# Patient Record
Sex: Female | Born: 1995 | Race: White | Hispanic: No | Marital: Single | Smoking: Current every day smoker
Health system: Southern US, Community
[De-identification: ages and names within clinical notes are randomized; demographics above are authoritative.]

---

## 2021-11-28 ENCOUNTER — Emergency Department (HOSPITAL_COMMUNITY): Payer: Self-pay

## 2021-11-28 ENCOUNTER — Emergency Department (HOSPITAL_COMMUNITY): Payer: Self-pay | Admitting: Anesthesiology

## 2021-11-28 ENCOUNTER — Encounter (HOSPITAL_COMMUNITY): Payer: Self-pay

## 2021-11-28 ENCOUNTER — Encounter (HOSPITAL_COMMUNITY)
Admission: EM | Discharge status: Against Medical Advice | Payer: Self-pay | Source: Home / Self Care | Attending: Neurological Surgery

## 2021-11-28 ENCOUNTER — Inpatient Hospital Stay (HOSPITAL_COMMUNITY)
Admission: EM | Admit: 2021-11-28 | Discharge: 2021-12-07 | DRG: 853 | Discharge status: Against Medical Advice | Payer: Self-pay | Attending: Neurological Surgery | Admitting: Neurological Surgery

## 2021-11-28 ENCOUNTER — Other Ambulatory Visit: Payer: Self-pay

## 2021-11-28 DIAGNOSIS — F1110 Opioid abuse, uncomplicated: Secondary | ICD-10-CM | POA: Diagnosis present

## 2021-11-28 DIAGNOSIS — G061 Intraspinal abscess and granuloma: Secondary | ICD-10-CM

## 2021-11-28 DIAGNOSIS — B9561 Methicillin susceptible Staphylococcus aureus infection as the cause of diseases classified elsewhere: Secondary | ICD-10-CM

## 2021-11-28 DIAGNOSIS — R29898 Other symptoms and signs involving the musculoskeletal system: Secondary | ICD-10-CM

## 2021-11-28 DIAGNOSIS — K5900 Constipation, unspecified: Secondary | ICD-10-CM | POA: Diagnosis not present

## 2021-11-28 DIAGNOSIS — F1990 Other psychoactive substance use, unspecified, uncomplicated: Secondary | ICD-10-CM

## 2021-11-28 DIAGNOSIS — G9529 Other cord compression: Secondary | ICD-10-CM | POA: Diagnosis present

## 2021-11-28 DIAGNOSIS — R7881 Bacteremia: Secondary | ICD-10-CM

## 2021-11-28 DIAGNOSIS — B182 Chronic viral hepatitis C: Secondary | ICD-10-CM | POA: Diagnosis present

## 2021-11-28 DIAGNOSIS — Z5329 Procedure and treatment not carried out because of patient's decision for other reasons: Secondary | ICD-10-CM | POA: Diagnosis present

## 2021-11-28 DIAGNOSIS — A4101 Sepsis due to Methicillin susceptible Staphylococcus aureus: Principal | ICD-10-CM | POA: Diagnosis present

## 2021-11-28 DIAGNOSIS — G062 Extradural and subdural abscess, unspecified: Secondary | ICD-10-CM | POA: Diagnosis present

## 2021-11-28 DIAGNOSIS — G8220 Paraplegia, unspecified: Secondary | ICD-10-CM | POA: Diagnosis present

## 2021-11-28 DIAGNOSIS — Z6831 Body mass index (BMI) 31.0-31.9, adult: Secondary | ICD-10-CM

## 2021-11-28 DIAGNOSIS — E669 Obesity, unspecified: Secondary | ICD-10-CM | POA: Diagnosis present

## 2021-11-28 DIAGNOSIS — G839 Paralytic syndrome, unspecified: Secondary | ICD-10-CM

## 2021-11-28 DIAGNOSIS — R202 Paresthesia of skin: Principal | ICD-10-CM

## 2021-11-28 DIAGNOSIS — F17210 Nicotine dependence, cigarettes, uncomplicated: Secondary | ICD-10-CM | POA: Diagnosis present

## 2021-11-28 HISTORY — PX: THORACIC LAMINECTOMY FOR EPIDURAL ABSCESS: SHX6115

## 2021-11-28 LAB — SEDIMENTATION RATE: Sed Rate: 82 mm/hr — ABNORMAL HIGH (ref 0–22)

## 2021-11-28 LAB — CBC WITH DIFFERENTIAL/PLATELET
Abs Immature Granulocytes: 0.41 10*3/uL — ABNORMAL HIGH (ref 0.00–0.07)
Basophils Absolute: 0.1 10*3/uL (ref 0.0–0.1)
Basophils Relative: 0 %
Eosinophils Absolute: 0.1 10*3/uL (ref 0.0–0.5)
Eosinophils Relative: 0 %
HCT: 35.5 % — ABNORMAL LOW (ref 36.0–46.0)
Hemoglobin: 12 g/dL (ref 12.0–15.0)
Immature Granulocytes: 2 %
Lymphocytes Relative: 11 %
Lymphs Abs: 2.6 10*3/uL (ref 0.7–4.0)
MCH: 29.2 pg (ref 26.0–34.0)
MCHC: 33.8 g/dL (ref 30.0–36.0)
MCV: 86.4 fL (ref 80.0–100.0)
Monocytes Absolute: 1.5 10*3/uL — ABNORMAL HIGH (ref 0.1–1.0)
Monocytes Relative: 6 %
Neutro Abs: 20 10*3/uL — ABNORMAL HIGH (ref 1.7–7.7)
Neutrophils Relative %: 81 %
Platelets: 450 10*3/uL — ABNORMAL HIGH (ref 150–400)
RBC: 4.11 MIL/uL (ref 3.87–5.11)
RDW: 13.1 % (ref 11.5–15.5)
WBC: 24.6 10*3/uL — ABNORMAL HIGH (ref 4.0–10.5)
nRBC: 0 % (ref 0.0–0.2)

## 2021-11-28 LAB — COMPREHENSIVE METABOLIC PANEL
ALT: 33 U/L (ref 0–44)
AST: 29 U/L (ref 15–41)
Albumin: 2.5 g/dL — ABNORMAL LOW (ref 3.5–5.0)
Alkaline Phosphatase: 102 U/L (ref 38–126)
Anion gap: 9 (ref 5–15)
BUN: 7 mg/dL (ref 6–20)
CO2: 25 mmol/L (ref 22–32)
Calcium: 8.8 mg/dL — ABNORMAL LOW (ref 8.9–10.3)
Chloride: 96 mmol/L — ABNORMAL LOW (ref 98–111)
Creatinine, Ser: 0.51 mg/dL (ref 0.44–1.00)
GFR, Estimated: >60 mL/min (ref >60)
Glucose, Bld: 104 mg/dL — ABNORMAL HIGH (ref 70–99)
Potassium: 4.7 mmol/L (ref 3.5–5.1)
Sodium: 130 mmol/L — ABNORMAL LOW (ref 135–145)
Total Bilirubin: 0.5 mg/dL (ref 0.3–1.2)
Total Protein: 7.6 g/dL (ref 6.5–8.1)

## 2021-11-28 LAB — URINALYSIS, ROUTINE W REFLEX MICROSCOPIC
Bilirubin Urine: NEGATIVE
Glucose, UA: NEGATIVE mg/dL
Ketones, ur: NEGATIVE mg/dL
Nitrite: POSITIVE — AB
Protein, ur: NEGATIVE mg/dL
Specific Gravity, Urine: 1.013 (ref 1.005–1.030)
WBC, UA: >50 WBC/hpf — ABNORMAL HIGH (ref 0–5)
pH: 7 (ref 5.0–8.0)

## 2021-11-28 LAB — HIV ANTIBODY (ROUTINE TESTING W REFLEX): HIV Screen 4th Generation wRfx: NONREACTIVE

## 2021-11-28 LAB — ACETAMINOPHEN LEVEL: Acetaminophen (Tylenol), Serum: <10 ug/mL — ABNORMAL LOW (ref 10–30)

## 2021-11-28 LAB — PROTIME-INR
INR: 1.2 (ref 0.8–1.2)
Prothrombin Time: 14.9 seconds (ref 11.4–15.2)

## 2021-11-28 LAB — RAPID URINE DRUG SCREEN, HOSP PERFORMED
Amphetamines: POSITIVE — AB
Barbiturates: NOT DETECTED
Benzodiazepines: NOT DETECTED
Cocaine: NOT DETECTED
Opiates: NOT DETECTED
Tetrahydrocannabinol: NOT DETECTED

## 2021-11-28 LAB — I-STAT BETA HCG BLOOD, ED (MC, WL, AP ONLY): I-stat hCG, quantitative: <5 m[IU]/mL (ref <5)

## 2021-11-28 LAB — C-REACTIVE PROTEIN: CRP: 19.3 mg/dL — ABNORMAL HIGH (ref <1.0)

## 2021-11-28 LAB — SURGICAL PCR SCREEN
MRSA, PCR: NEGATIVE
Staphylococcus aureus: POSITIVE — AB

## 2021-11-28 LAB — HEPATITIS C ANTIBODY: HCV Ab: REACTIVE — AB

## 2021-11-28 IMAGING — MR MR CERVICAL SPINE WO/W CM
5 of 8 series · 19 of 48 positions shown · IV contrast (7.5 GAD)
Comparison: None.

CLINICAL DATA: Bilateral lower extremity numbness beginning 2 days
ago with proximal progression to the T8 level. Bilateral leg
weakness.

EXAM:
MRI CERVICAL SPINE WITHOUT AND WITH CONTRAST
TECHNIQUE: Multiplanar and multiecho pulse sequences of the cervical spine, to
include the craniocervical junction and cervicothoracic junction,
were obtained without and with intravenous contrast.
CONTRAST:  7.5mL GADAVIST GADOBUTROL 1 MMOL/ML IV SOLN

[Series 15: T2 · sagittal · 3.0mm · 0.39mm/px · 3 of 17 slices shown (1 of 2)]
[im 1/17]
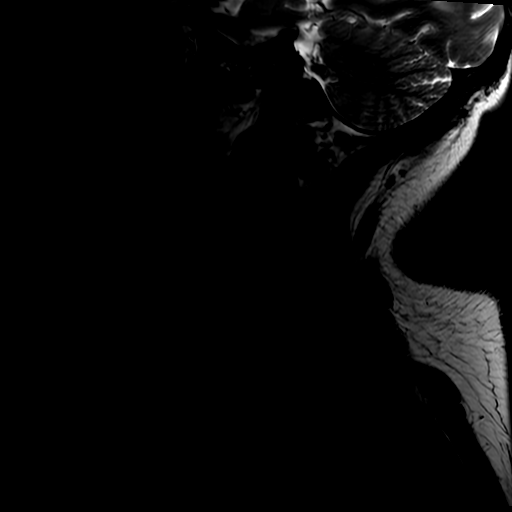
[im 9/17]
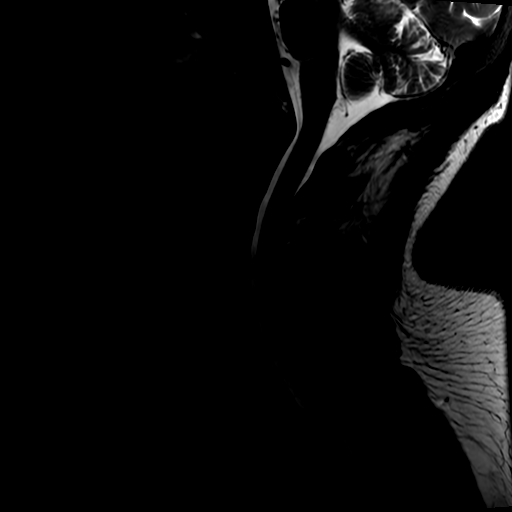
[im 17/17]
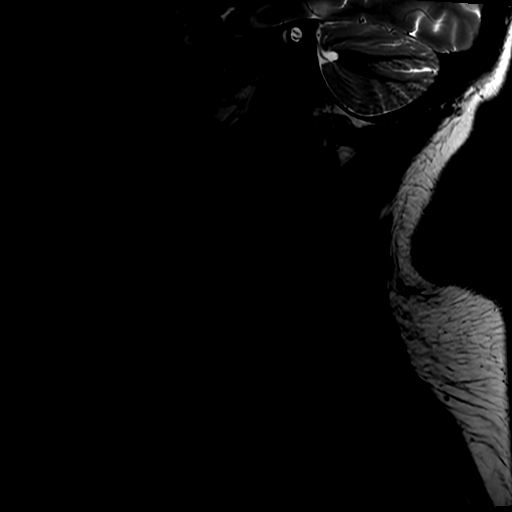

[Series 17: STIR · sagittal · 3.0mm · 0.39mm/px · 1 of 17 slices shown]
[im 1/17]
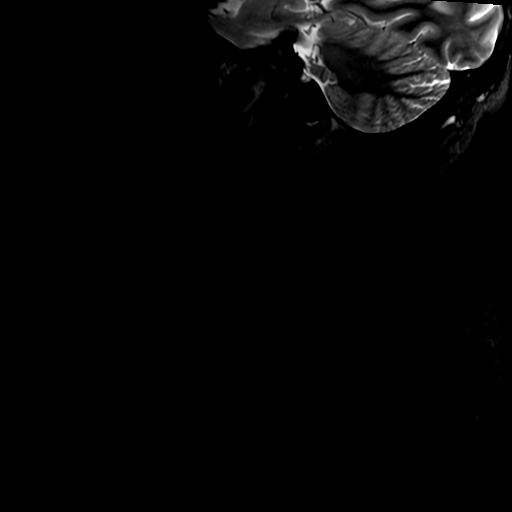

[Series 19: T2 · axial · 3.0mm · 0.35mm/px · z∈[-56,+60]mm · 6 of 33 slices shown (2 of 2)]
[im 1/33]
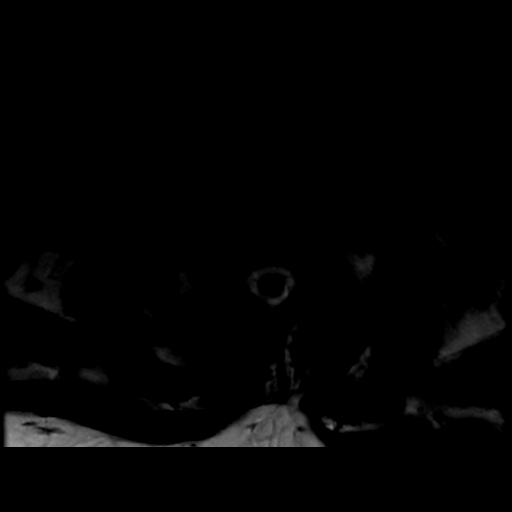
[im 7/33]
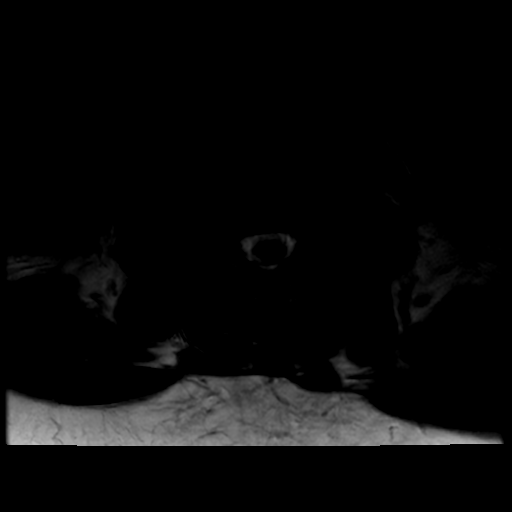
[im 13/33]
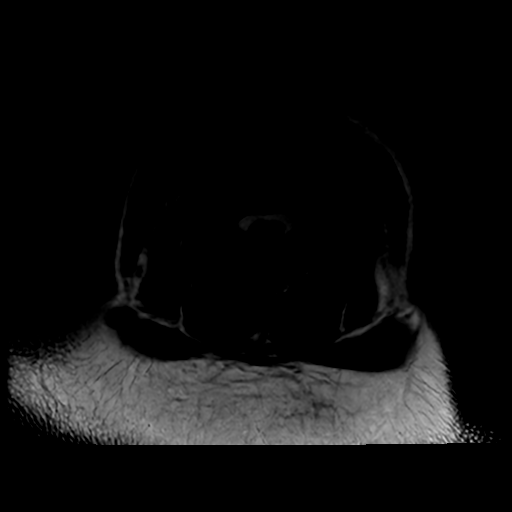
[im 20/33]
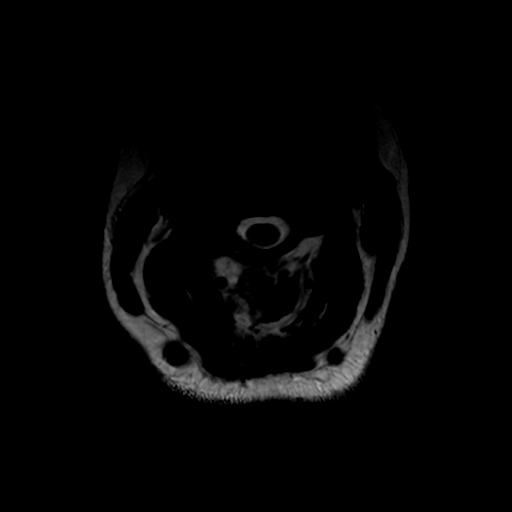
[im 26/33]
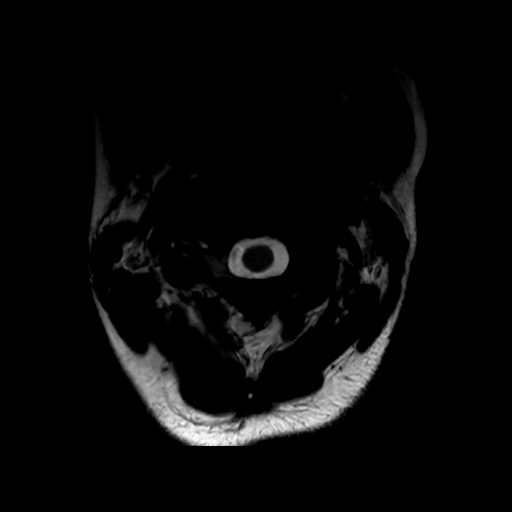
[im 33/33]
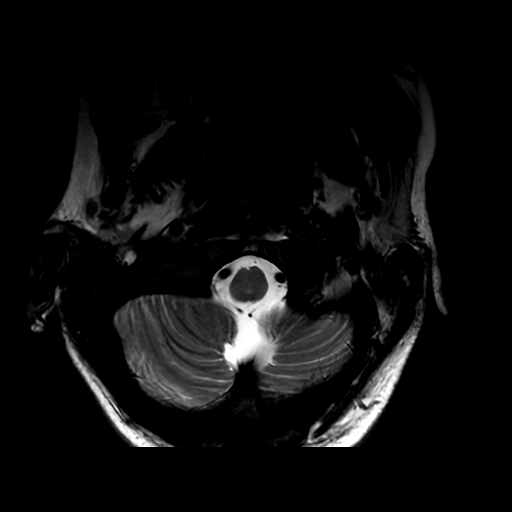

[Series 20: T1 · axial · non-contrast · 3.0mm · 0.35mm/px · z∈[-56,+60]mm · 6 of 34 slices shown]
[im 1/34]
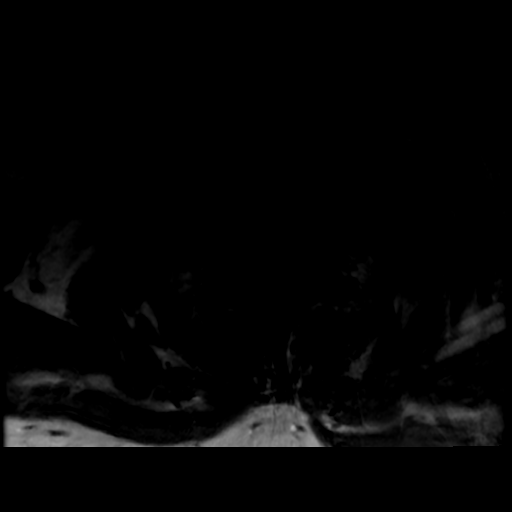
[im 7/34]
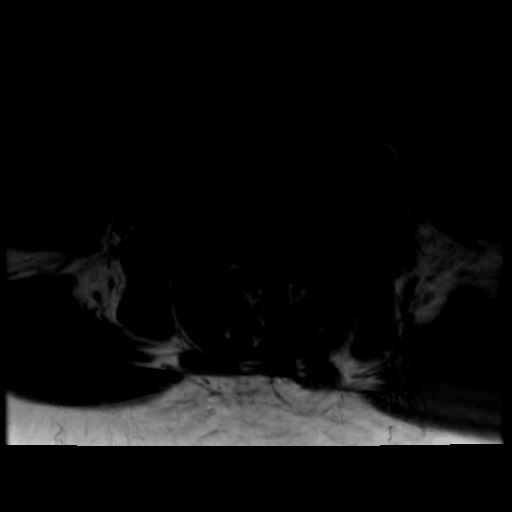
[im 14/34]
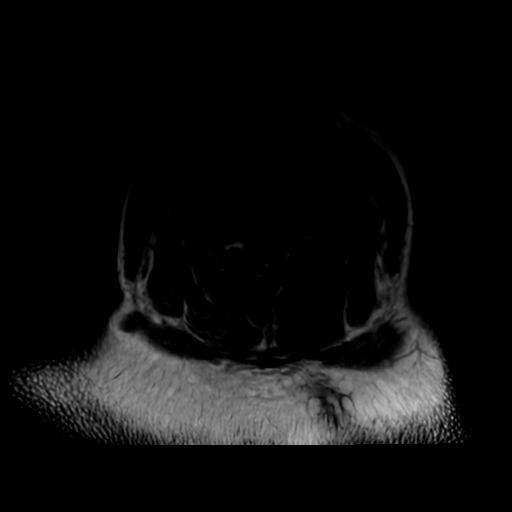
[im 20/34]
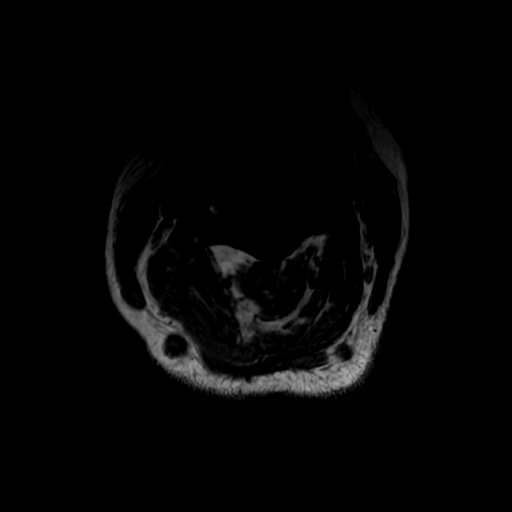
[im 27/34]
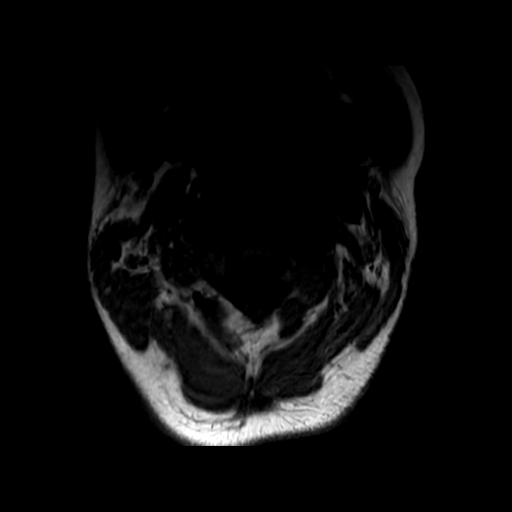
[im 34/34]
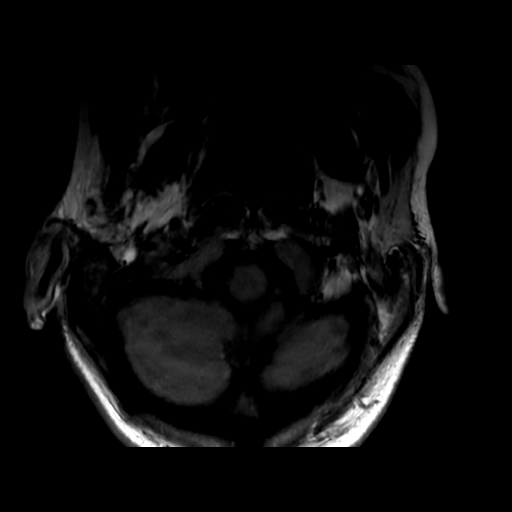

[Series 31: T1 fat-sat post-contrast · sagittal · 3.0mm · 0.39mm/px · 3 of 17 slices shown]
[im 1/17]
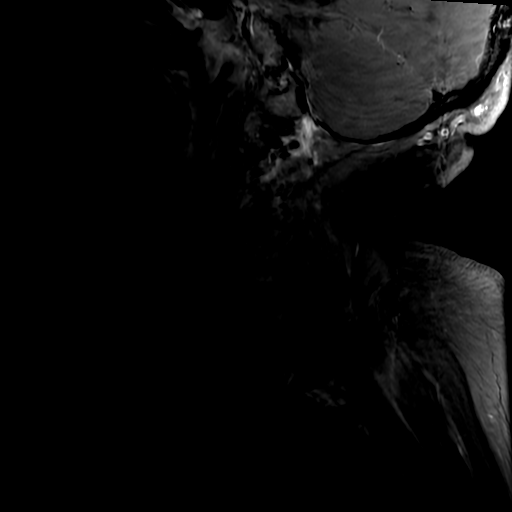
[im 9/17]
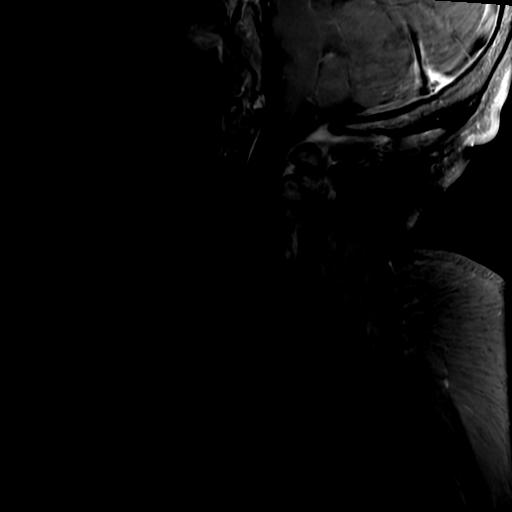
[im 17/17]
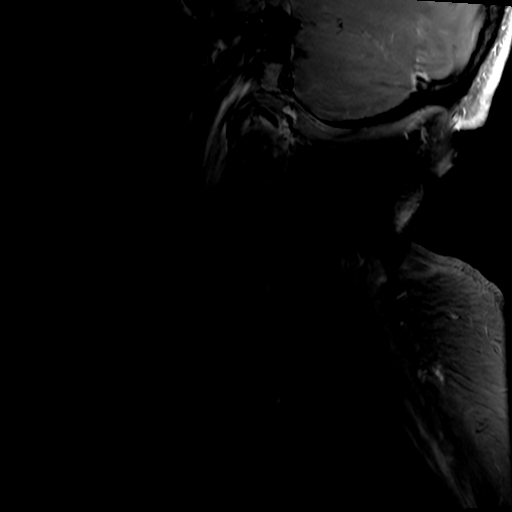

[19 of 48 positions shown; findings below may reference images not displayed]

FINDINGS: The study is motion degraded, including moderate to severe motion
artifact on multiple axial sequences.

Alignment: Normal.

Vertebrae: Diffusely diminished bone marrow T1 signal intensity,
nonspecific but can be seen with anemia, smoking, and obesity. No
suspicious focal marrow lesion, fracture, significant marrow edema,
or evidence of discitis.

Cord: Normal signal and morphology.

Posterior Fossa, vertebral arteries, paraspinal tissues:
Unremarkable.

Disc levels:

Preserved disc height and hydration throughout the cervical spine.
No sizable disc herniation or stenosis.
IMPRESSION: Motion degraded examination. No disc herniation or stenosis. Normal
appearance of the cervical spinal cord.

## 2021-11-28 IMAGING — MR MR THORACIC SPINE WO/W CM
5 of 9 series · 18 of 48 positions shown · IV contrast (gadavist)
Comparison: None.

CLINICAL DATA: Mid-back pain, neuro deficit. Bilateral lower
extremity numbness beginning 2 days ago with proximal progression to
the T8 level. Bilateral leg weakness.

EXAM:
MRI THORACIC WITHOUT AND WITH CONTRAST
TECHNIQUE: Multiplanar and multiecho pulse sequences of the thoracic spine were
obtained without and with intravenous contrast.
CONTRAST:  7.5mL GADAVIST GADOBUTROL 1 MMOL/ML IV SOLN

[Series 22: T1 · sagittal · 3.0mm · 0.90mm/px · 1 of 16 slices shown (1 of 3)]
[im 1/16]
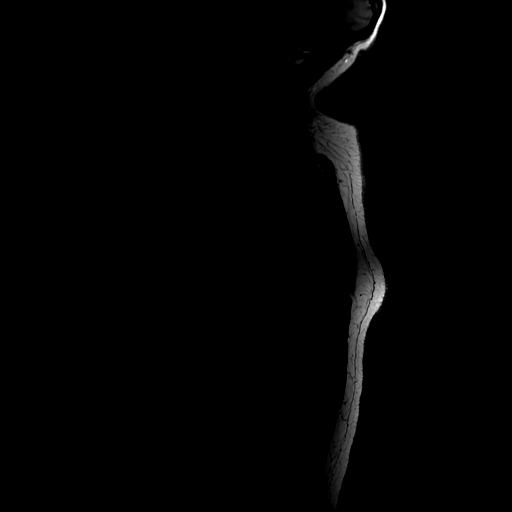

[Series 23: T2 · sagittal · 3.0mm · 0.66mm/px · 2 of 18 slices shown (1 of 2)]
[im 1/18]
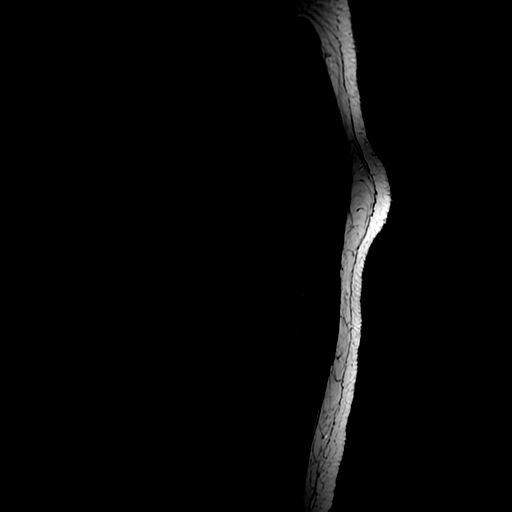
[im 18/18]
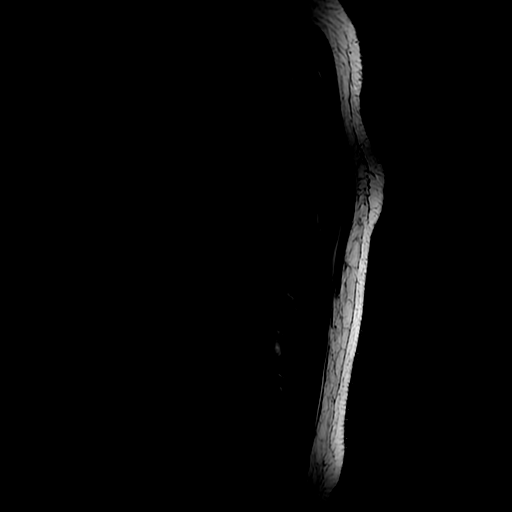

[Series 25: T1 · sagittal · 3.0mm · 0.66mm/px · 3 of 18 slices shown (2 of 3)]
[im 1/18]
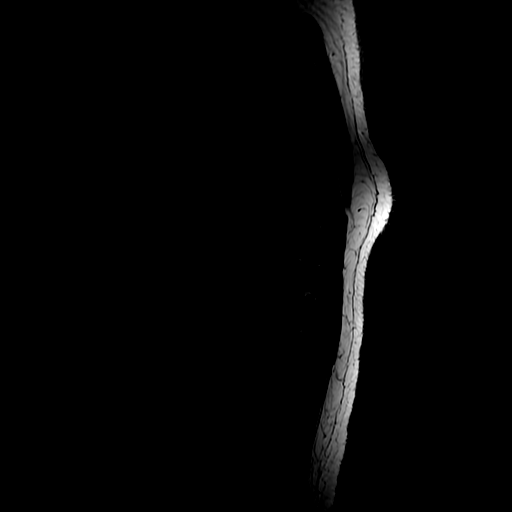
[im 9/18]
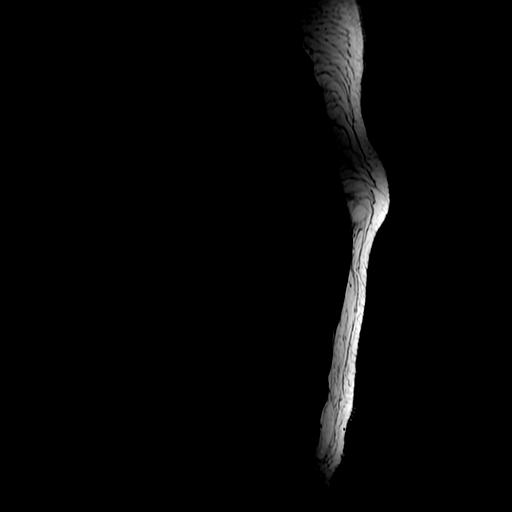
[im 18/18]
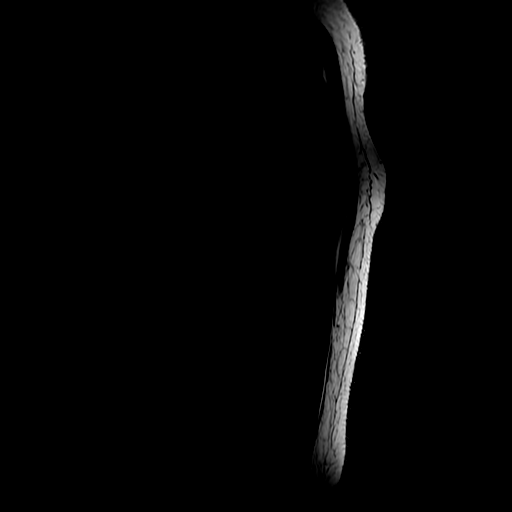

[Series 27: T2 · axial · 4.0mm · 0.39mm/px · z∈[-344,-52]mm · 9 of 55 slices shown (2 of 2)]
[im 1/55]
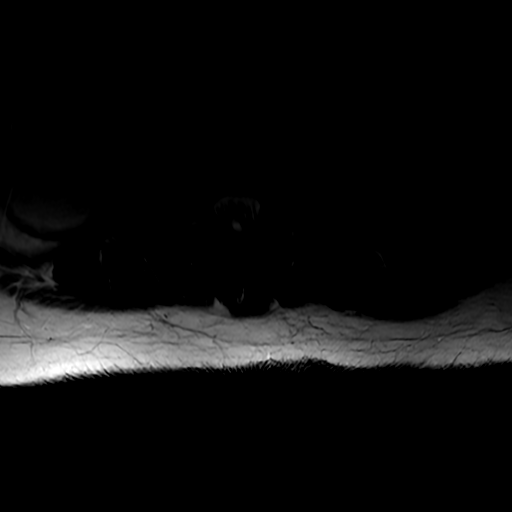
[im 7/55]
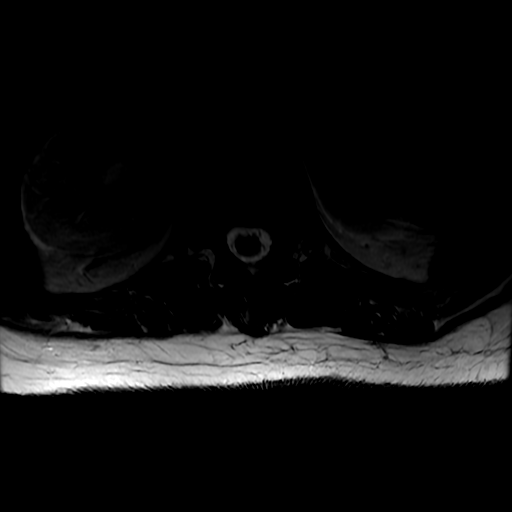
[im 14/55]
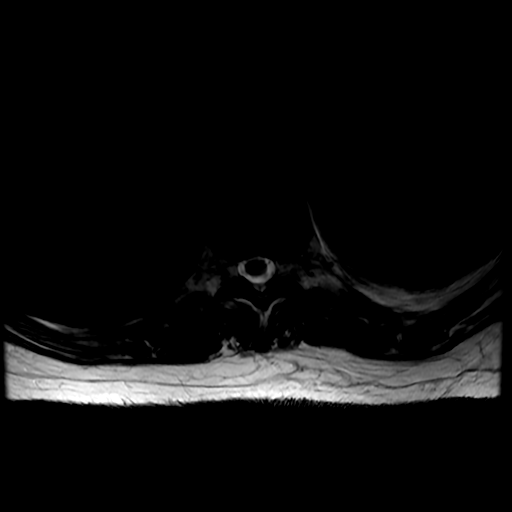
[im 21/55]
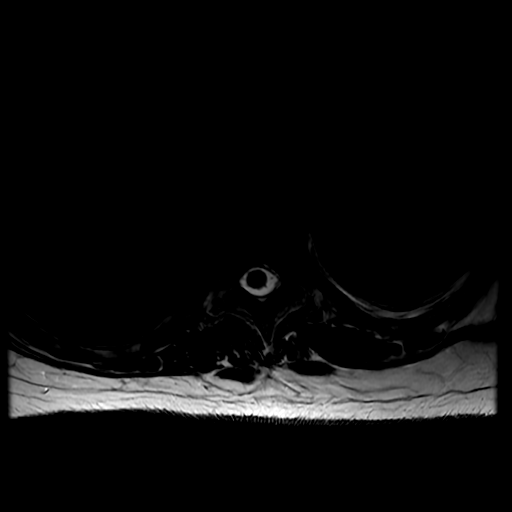
[im 28/55]
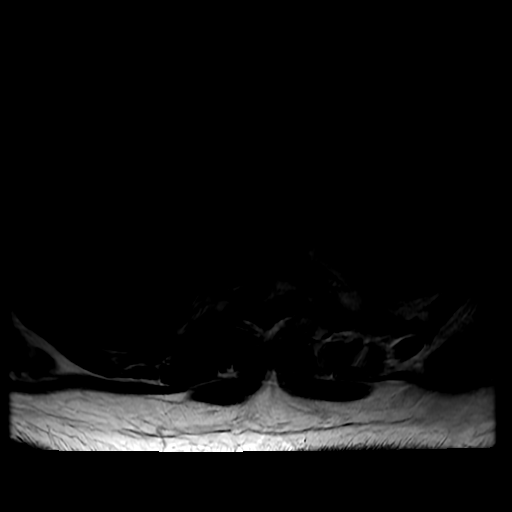
[im 34/55]
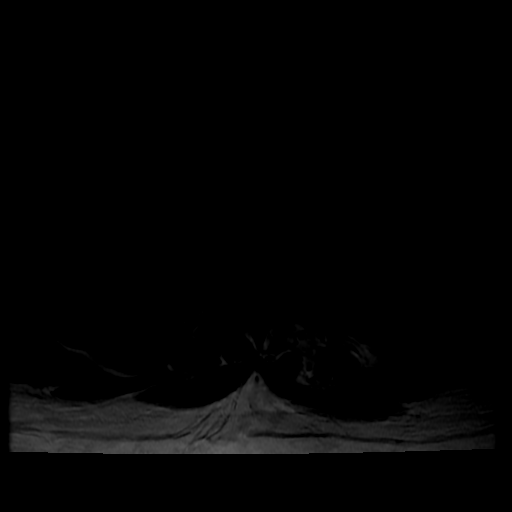
[im 41/55]
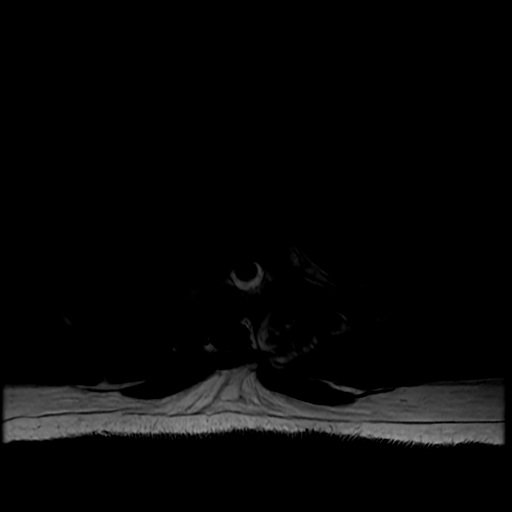
[im 48/55]
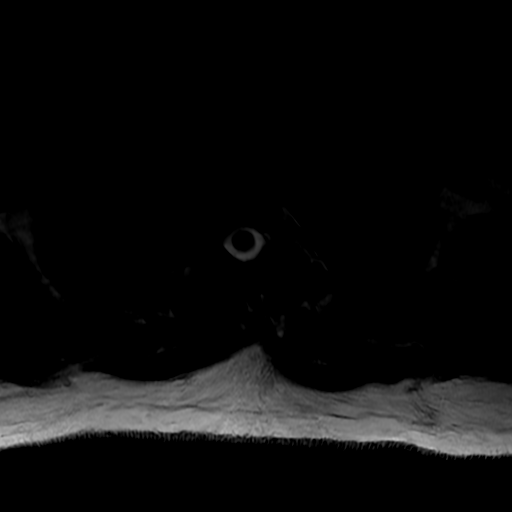
[im 55/55]
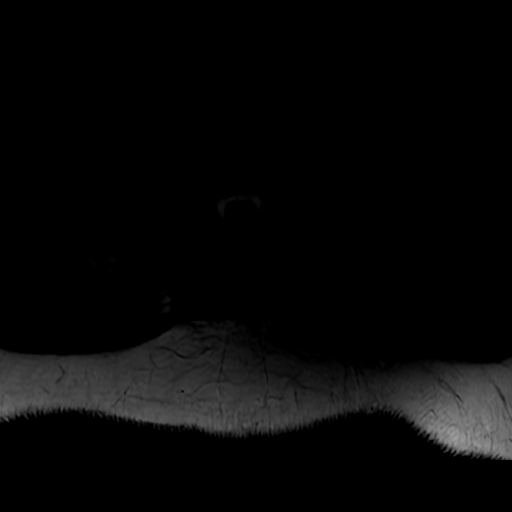

[Series 28: T1 · axial · non-contrast · 4.0mm · 0.39mm/px · z∈[-344,-274]mm · 3 of 55 slices shown (3 of 3)]
[im 1/55]
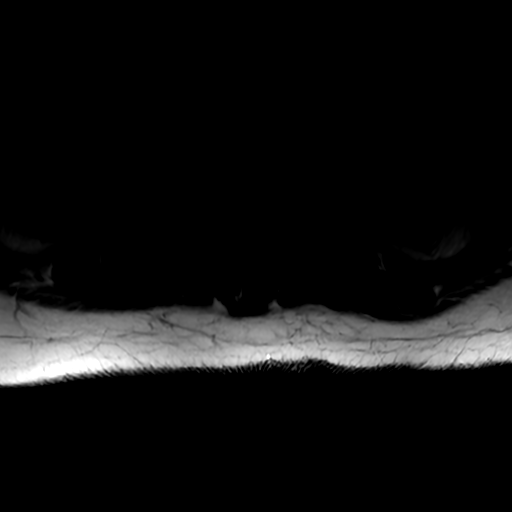
[im 7/55]
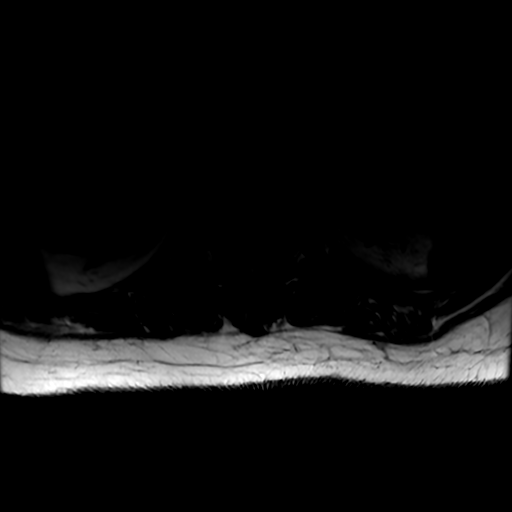
[im 14/55]
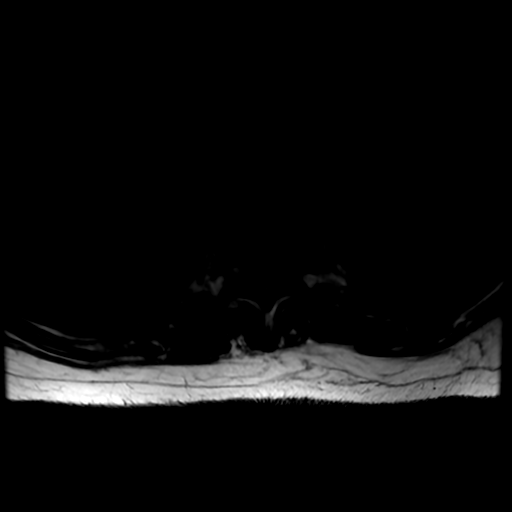

[18 of 48 positions shown; findings below may reference images not displayed]

FINDINGS: Alignment:  Mild lower thoracic levoscoliosis.  No listhesis.

Vertebrae: Diffusely diminished bone marrow T1 signal intensity,
nonspecific though can be seen with anemia, smoking, and obesity. No
suspicious focal marrow lesion, fracture, or evidence of discitis.
Multiple small Schmorl's nodes in the lower thoracic spine. Fluid in
the left T6-7 facet joint with associated mild marrow edema and
enhancement and more prominent edema and enhancement in the adjacent
paraspinal soft tissues. Abnormal T2 hyperintense dorsal epidural
material in the spinal canal extending from T3-T8, greatest from
T5-T7 where it measures up to 8 mm in AP thickness. This epidural
material largely enhances, however there are a few subcentimeter
nonenhancing foci at T6.

Cord: The above described epidural process results in severe cord
compression at T6, and cord edema is suspected at T6-7.

Paraspinal and other soft tissues: Left-sided paraspinal soft tissue
inflammation as noted above, extending superiorly greater than
inferiorly from the T6-7 facet joint. 1 cm nonenhancing fluid
collection posterosuperior to the left T6-7 facet joint. Partially
visualized mild bilateral hydronephrosis, right greater than left.
Trace left pleural effusion.

Disc levels:

Severe spinal stenosis from T5-6 to T6-7 by the epidural process.
IMPRESSION: 1. Dorsal epidural collection in the midthoracic spine most
consistent with infectious phlegmon/abscess. This results in severe
spinal stenosis with cord compression and likely cord edema.
2. Inflammation about the left T6-7 facet joint likely reflecting
septic facet arthritis with adjacent 1 cm abscess in the paraspinal
soft tissues.

Critical Value/emergent results were called by telephone at the time
of interpretation on [DATE] at [DATE] to Dr. CELVIN, who
verbally acknowledged these results.

## 2021-11-28 IMAGING — MR MR HEAD WO/W CM
7 of 13 series · 27 of 48 positions shown · IV contrast (gadavist)
Comparison: Head CT [DATE]

CLINICAL DATA: Motor neuron disease suspected. Bilateral lower
extremity numbness beginning 2 days ago with proximal progression to
the T8 level. Bilateral leg weakness.

EXAM:
MRI HEAD WITHOUT AND WITH CONTRAST
TECHNIQUE: Multiplanar, multiecho pulse sequences of the brain and surrounding
structures were obtained without and with intravenous contrast.
CONTRAST:  7.5mL GADAVIST GADOBUTROL 1 MMOL/ML IV SOLN

[Series 3: DWI · axial · 3.0mm · 0.94mm/px · z∈[-136,+9]mm · 7 of 100 slices shown (1 of 2)]
[im 1/100]
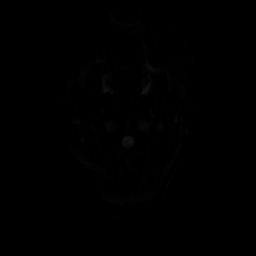
[im 17/100]
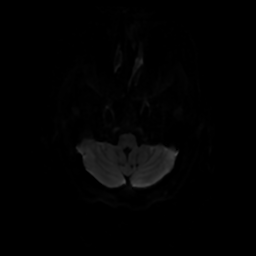
[im 34/100]
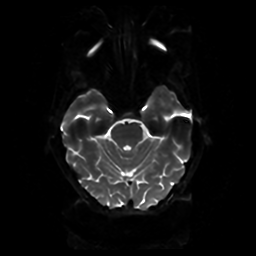
[im 50/100]
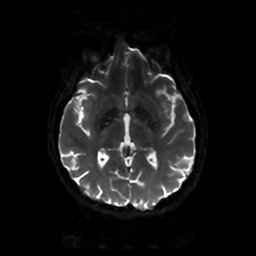
[im 67/100]
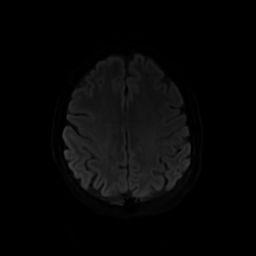
[im 83/100]
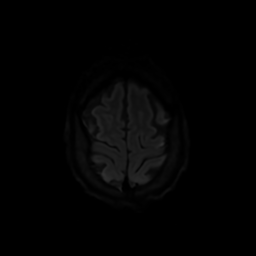
[im 100/100]
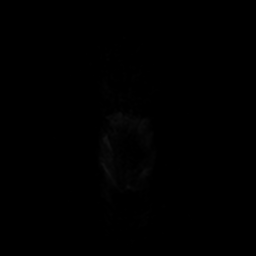

[Series 4: DWI · coronal · 4.0mm · 0.94mm/px · 5 of 74 slices shown (2 of 2)]
[im 1/74]
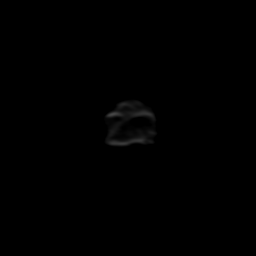
[im 19/74]
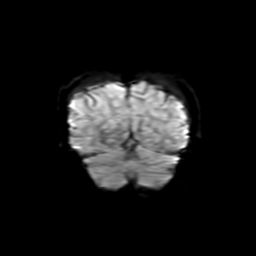
[im 37/74]
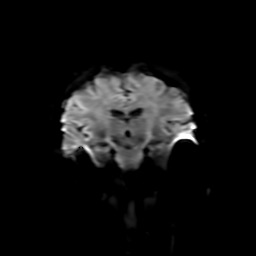
[im 55/74]
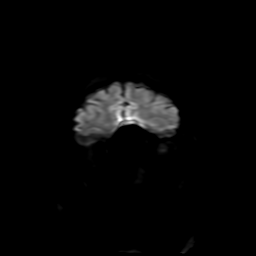
[im 74/74]
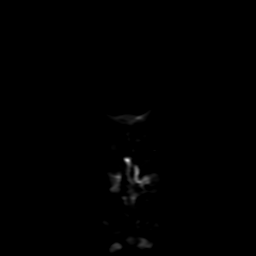

[Series 5: FLAIR · sagittal · 5.0mm · 0.23mm/px · 2 of 24 slices shown (1 of 3)]
[im 1/24]
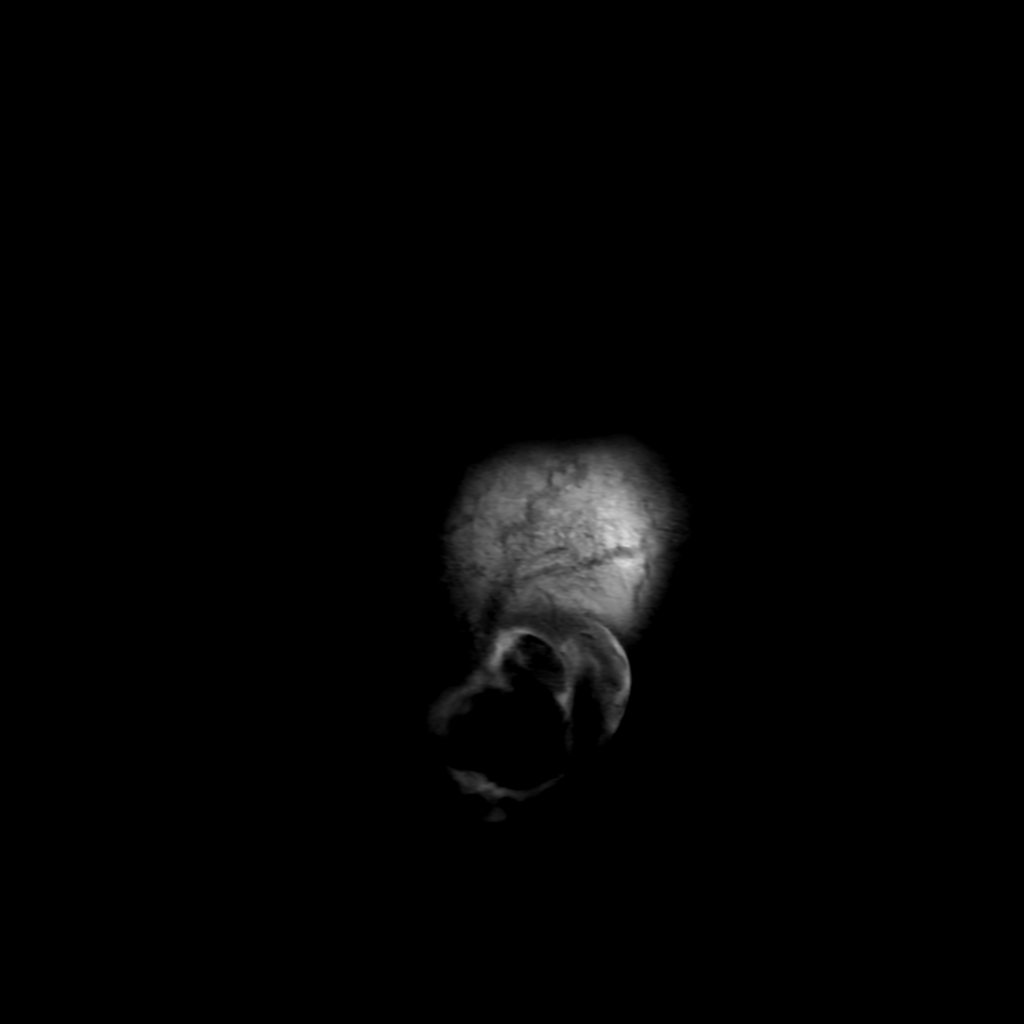
[im 24/24]
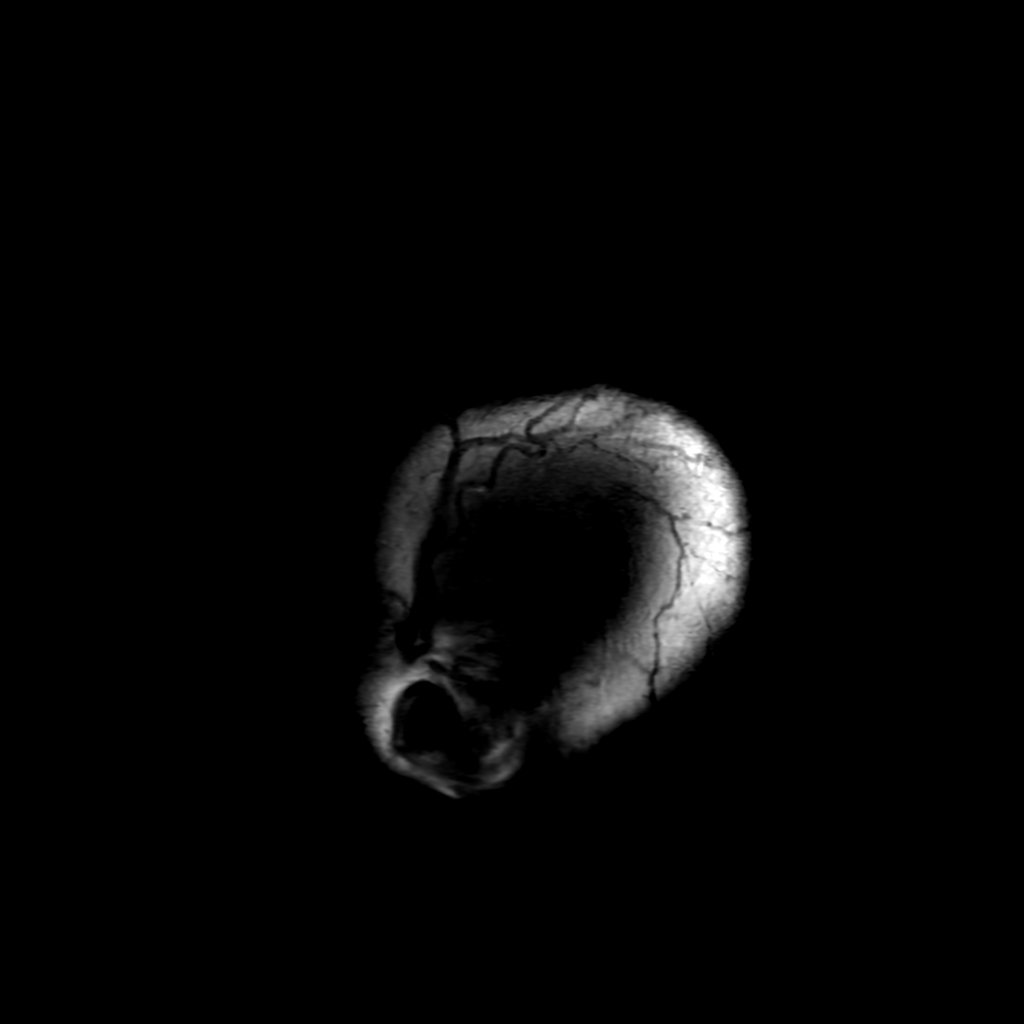

[Series 7: FLAIR · axial · 4.0mm · 0.47mm/px · z∈[-135,+8]mm · 3 of 34 slices shown (2 of 3)]
[im 1/34]
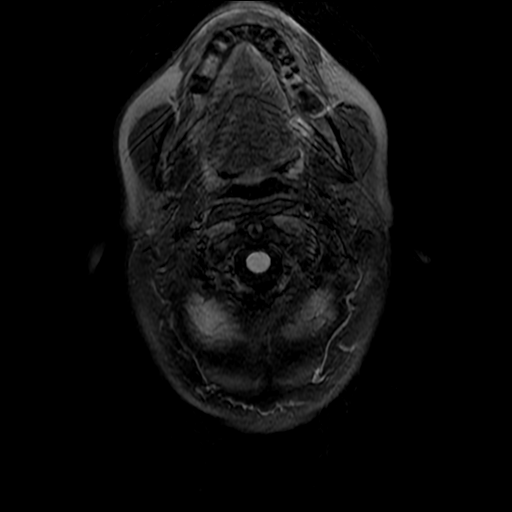
[im 17/34]
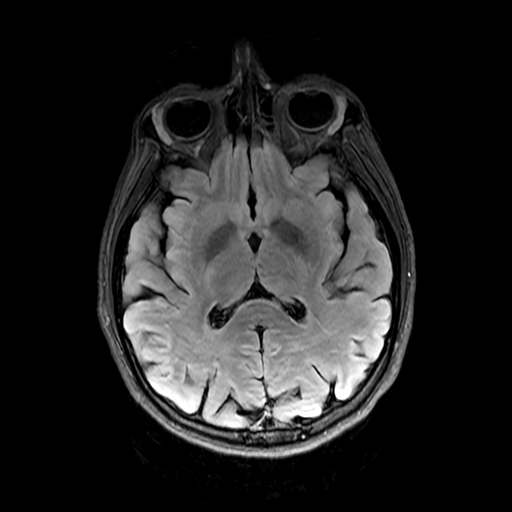
[im 34/34]
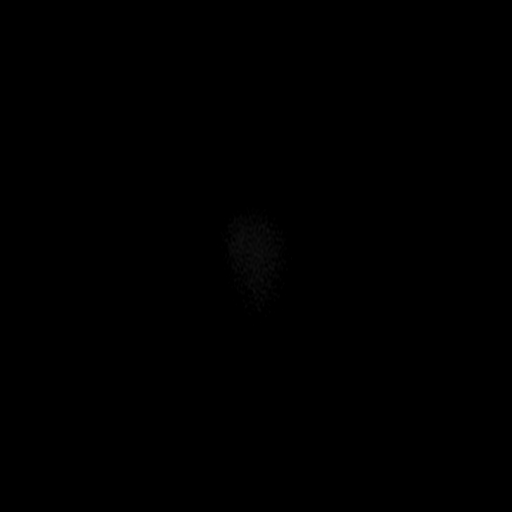

[Series 9: FLAIR · axial · 4.0mm · 0.47mm/px · z∈[-135,+8]mm · 3 of 34 slices shown (3 of 3)]
[im 1/34]
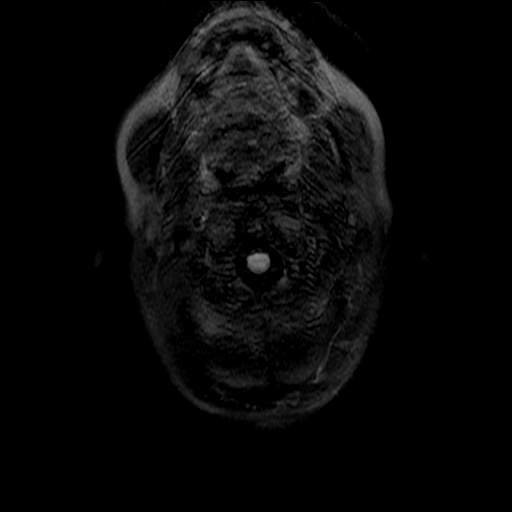
[im 17/34]
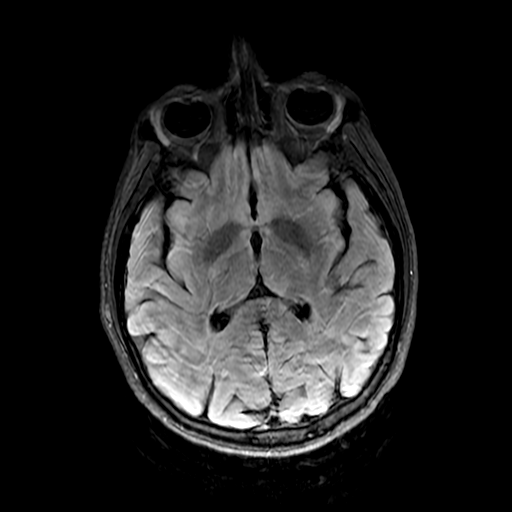
[im 34/34]
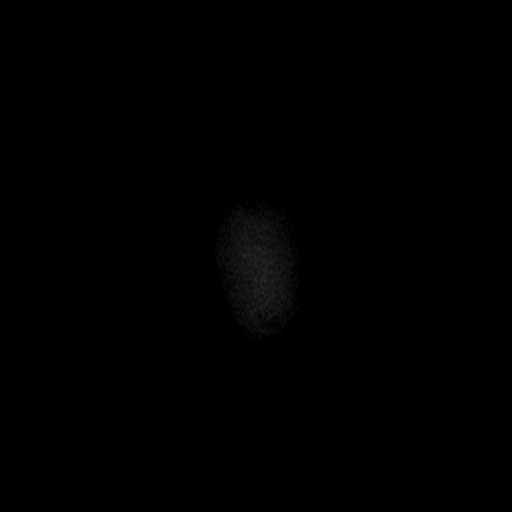

[Series 350: ADC · axial · 3.0mm · 0.94mm/px · z∈[-136,+9]mm · 4 of 50 slices shown (1 of 2)]
[im 1/50]
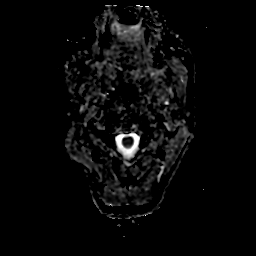
[im 17/50]
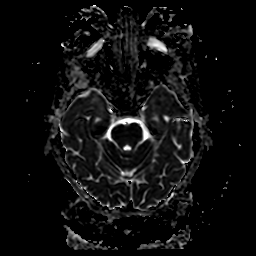
[im 33/50]
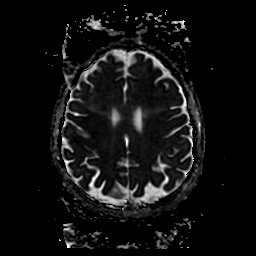
[im 50/50]
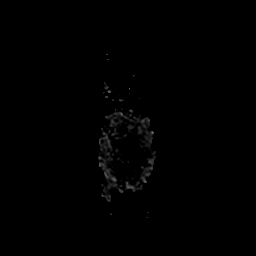

[Series 450: ADC · coronal · 4.0mm · 0.94mm/px · 3 of 37 slices shown (2 of 2)]
[im 1/37]
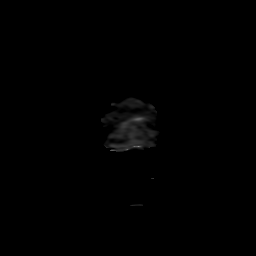
[im 19/37]
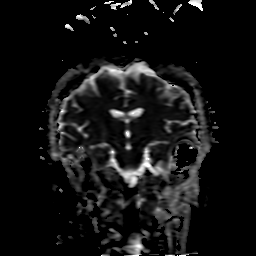
[im 37/37]
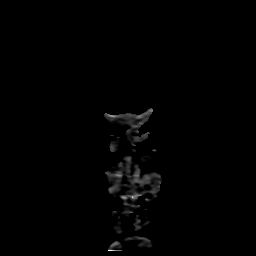

[27 of 48 positions shown; findings below may reference images not displayed]

FINDINGS: The study is moderately motion degraded.

Brain: There is no evidence of an acute infarct, intracranial
hemorrhage, mass, midline shift, or extra-axial fluid collection.
The ventricles and sulci are normal. No brain parenchymal signal
abnormality or abnormal enhancement is identified within limitations
of motion artifact.

Vascular: Major intracranial vascular flow voids are preserved.

Skull and upper cervical spine: No suspicious marrow lesion.

Sinuses/Orbits: Unremarkable orbits. Paranasal sinuses and mastoid
air cells are clear.

Other: None.
IMPRESSION: Negative brain MRI within limitations of motion artifact.

## 2021-11-28 IMAGING — RF DG THORACOLUMBAR SPINE 2V
1 series · 1 of 1 positions shown · non-contrast
Comparison: MR thoracic spine dated [DATE]

CLINICAL DATA: Localizing image 4 thoracic laminectomy for epidural
abscess.

EXAM:
THORACOLUMBAR SPINE 1V

[Series 1: run · 1 of 1 slices shown]
[im 1/1]
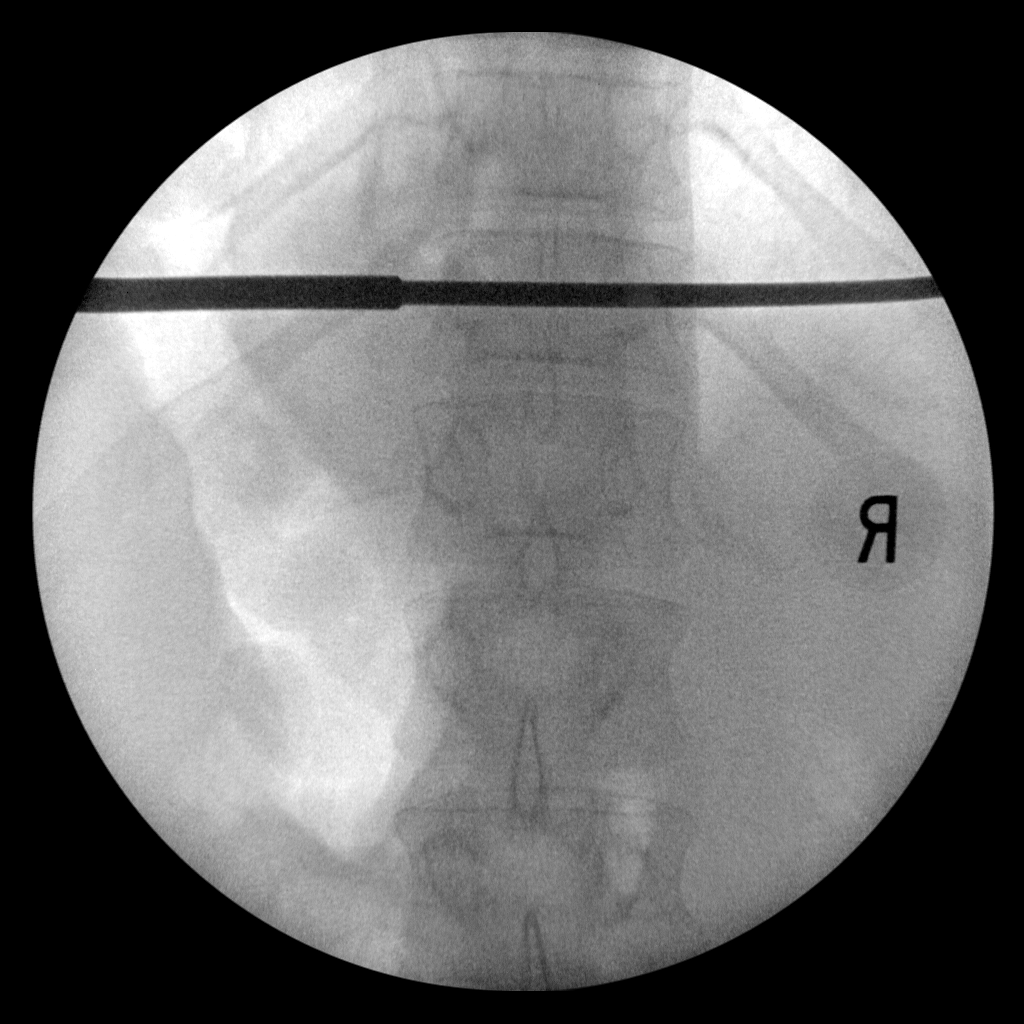

[1 of 1 positions shown; findings below may reference images not displayed]

FINDINGS: Intraoperative utilization of fluoroscopy for localization. Total
fluoroscopic time was 22 seconds. Total radiation dose was
mGy.
IMPRESSION: Intraoperative utilization of fluoroscopy for localization.

## 2021-11-28 SURGERY — THORACIC LAMINECTOMY FOR EPIDURAL ABSCESS
Anesthesia: General

## 2021-11-28 MED ORDER — HEMOSTATIC AGENTS (NO CHARGE) OPTIME
TOPICAL | Status: DC | PRN
  Administered 2021-11-28: 1 via TOPICAL

## 2021-11-28 MED ORDER — GADOBUTROL 1 MMOL/ML IV SOLN
7.5000 mL | Freq: Once | INTRAVENOUS | Status: AC | PRN
  Administered 2021-11-28: 7.5 mL via INTRAVENOUS

## 2021-11-28 MED ORDER — FENTANYL CITRATE (PF) 100 MCG/2ML IJ SOLN: 25.0000 ug | INTRAMUSCULAR | Status: DC | PRN

## 2021-11-28 MED ORDER — CELECOXIB 200 MG PO CAPS
200.0000 mg | ORAL_CAPSULE | Freq: Two times a day (BID) | ORAL | Status: DC
  Administered 2021-11-28 – 2021-12-06 (×16): 200 mg via ORAL
  Filled 2021-11-28 (×18): qty 1

## 2021-11-28 MED ORDER — CEFAZOLIN SODIUM-DEXTROSE 2-4 GM/100ML-% IV SOLN
INTRAVENOUS | Status: AC
  Filled 2021-11-28: qty 100

## 2021-11-28 MED ORDER — PHENYLEPHRINE 40 MCG/ML (10ML) SYRINGE FOR IV PUSH (FOR BLOOD PRESSURE SUPPORT)
PREFILLED_SYRINGE | INTRAVENOUS | Status: DC | PRN
  Administered 2021-11-28 (×3): 80 ug via INTRAVENOUS

## 2021-11-28 MED ORDER — OXYCODONE HCL 5 MG/5ML PO SOLN: 5.0000 mg | Freq: Once | ORAL | Status: DC | PRN

## 2021-11-28 MED ORDER — VANCOMYCIN HCL 1000 MG IV SOLR
INTRAVENOUS | Status: DC | PRN
  Administered 2021-11-28: 1000 mg via TOPICAL

## 2021-11-28 MED ORDER — 0.9 % SODIUM CHLORIDE (POUR BTL) OPTIME
TOPICAL | Status: DC | PRN
  Administered 2021-11-28: 1000 mL

## 2021-11-28 MED ORDER — SODIUM CHLORIDE 0.9 % IV SOLN
250.0000 mL | INTRAVENOUS | Status: DC
  Administered 2021-11-28 – 2021-12-03 (×3): 250 mL via INTRAVENOUS

## 2021-11-28 MED ORDER — ONDANSETRON HCL 4 MG/2ML IJ SOLN
4.0000 mg | Freq: Four times a day (QID) | INTRAMUSCULAR | Status: DC | PRN
  Filled 2021-11-28: qty 2

## 2021-11-28 MED ORDER — HYDROMORPHONE HCL 1 MG/ML IJ SOLN
0.5000 mg | Freq: Once | INTRAMUSCULAR | Status: AC
Start: 2021-11-28 — End: 2021-11-28
  Administered 2021-11-28: 0.5 mg via INTRAVENOUS
  Filled 2021-11-28: qty 1

## 2021-11-28 MED ORDER — ACETAMINOPHEN 10 MG/ML IV SOLN: 1000.0000 mg | Freq: Once | INTRAVENOUS | Status: DC | PRN

## 2021-11-28 MED ORDER — THROMBIN 5000 UNITS EX SOLR
CUTANEOUS | Status: DC | PRN
  Administered 2021-11-28: 10000 [IU] via TOPICAL

## 2021-11-28 MED ORDER — BUPIVACAINE HCL (PF) 0.25 % IJ SOLN
INTRAMUSCULAR | Status: DC | PRN
  Administered 2021-11-28: 5 mL

## 2021-11-28 MED ORDER — LACTATED RINGERS IV SOLN: INTRAVENOUS | Status: DC

## 2021-11-28 MED ORDER — FENTANYL CITRATE (PF) 250 MCG/5ML IJ SOLN
INTRAMUSCULAR | Status: AC
  Filled 2021-11-28: qty 5

## 2021-11-28 MED ORDER — MIDAZOLAM HCL 5 MG/5ML IJ SOLN
INTRAMUSCULAR | Status: DC | PRN
  Administered 2021-11-28 (×2): 1 mg via INTRAVENOUS

## 2021-11-28 MED ORDER — HYDROMORPHONE HCL 1 MG/ML IJ SOLN
INTRAMUSCULAR | Status: AC
  Filled 2021-11-28: qty 0.5

## 2021-11-28 MED ORDER — PROPOFOL 10 MG/ML IV BOLUS
INTRAVENOUS | Status: DC | PRN
  Administered 2021-11-28: 200 mg via INTRAVENOUS

## 2021-11-28 MED ORDER — SUGAMMADEX SODIUM 200 MG/2ML IV SOLN
INTRAVENOUS | Status: DC | PRN
  Administered 2021-11-28: 200 mg via INTRAVENOUS

## 2021-11-28 MED ORDER — METHOCARBAMOL 1000 MG/10ML IJ SOLN
500.0000 mg | Freq: Four times a day (QID) | INTRAVENOUS | Status: DC | PRN
  Filled 2021-11-28 (×2): qty 5

## 2021-11-28 MED ORDER — THROMBIN 5000 UNITS EX SOLR
CUTANEOUS | Status: AC
  Filled 2021-11-28: qty 5000

## 2021-11-28 MED ORDER — MIDAZOLAM HCL 2 MG/2ML IJ SOLN
INTRAMUSCULAR | Status: AC
  Filled 2021-11-28: qty 2

## 2021-11-28 MED ORDER — LIDOCAINE 2% (20 MG/ML) 5 ML SYRINGE
INTRAMUSCULAR | Status: DC | PRN
  Administered 2021-11-28: 60 mg via INTRAVENOUS

## 2021-11-28 MED ORDER — GELATIN ABSORBABLE MT POWD
OROMUCOSAL | Status: DC | PRN
  Administered 2021-11-28: 5 mL via TOPICAL

## 2021-11-28 MED ORDER — SODIUM CHLORIDE 0.9 % IV SOLN: 2.0000 g | INTRAVENOUS | Status: DC

## 2021-11-28 MED ORDER — PHENOL 1.4 % MT LIQD: 1.0000 | OROMUCOSAL | Status: DC | PRN

## 2021-11-28 MED ORDER — ONDANSETRON HCL 4 MG PO TABS
4.0000 mg | ORAL_TABLET | Freq: Four times a day (QID) | ORAL | Status: DC | PRN
  Administered 2021-11-30: 4 mg via ORAL
  Filled 2021-11-28: qty 1

## 2021-11-28 MED ORDER — ALBUMIN HUMAN 5 % IV SOLN: INTRAVENOUS | Status: DC | PRN

## 2021-11-28 MED ORDER — SENNA 8.6 MG PO TABS
1.0000 | ORAL_TABLET | Freq: Two times a day (BID) | ORAL | Status: DC
  Administered 2021-11-28 – 2021-12-07 (×13): 8.6 mg via ORAL
  Filled 2021-11-28 (×17): qty 1

## 2021-11-28 MED ORDER — ROCURONIUM BROMIDE 100 MG/10ML IV SOLN
INTRAVENOUS | Status: DC | PRN
  Administered 2021-11-28: 30 mg via INTRAVENOUS
  Administered 2021-11-28: 10 mg via INTRAVENOUS
  Administered 2021-11-28: 60 mg via INTRAVENOUS

## 2021-11-28 MED ORDER — AMISULPRIDE (ANTIEMETIC) 5 MG/2ML IV SOLN: 10.0000 mg | Freq: Once | INTRAVENOUS | Status: DC | PRN

## 2021-11-28 MED ORDER — CHLORHEXIDINE GLUCONATE 0.12 % MT SOLN: 15.0000 mL | Freq: Once | OROMUCOSAL | Status: DC

## 2021-11-28 MED ORDER — CHLORHEXIDINE GLUCONATE 0.12 % MT SOLN
OROMUCOSAL | Status: AC
  Filled 2021-11-28: qty 15

## 2021-11-28 MED ORDER — POTASSIUM CHLORIDE IN NACL 20-0.9 MEQ/L-% IV SOLN
INTRAVENOUS | Status: DC
  Filled 2021-11-28 (×2): qty 1000

## 2021-11-28 MED ORDER — ACETAMINOPHEN 500 MG PO TABS
1000.0000 mg | ORAL_TABLET | Freq: Four times a day (QID) | ORAL | Status: AC
  Administered 2021-11-28 – 2021-11-29 (×4): 1000 mg via ORAL
  Filled 2021-11-28 (×4): qty 2

## 2021-11-28 MED ORDER — FLEET ENEMA 7-19 GM/118ML RE ENEM
1.0000 | ENEMA | Freq: Once | RECTAL | Status: DC | PRN
Start: 2021-11-28 — End: 2021-12-07

## 2021-11-28 MED ORDER — VANCOMYCIN HCL 750 MG/150ML IV SOLN
750.0000 mg | Freq: Two times a day (BID) | INTRAVENOUS | Status: DC
  Administered 2021-11-29: 750 mg via INTRAVENOUS
  Filled 2021-11-28 (×2): qty 150

## 2021-11-28 MED ORDER — FENTANYL CITRATE (PF) 250 MCG/5ML IJ SOLN
INTRAMUSCULAR | Status: DC | PRN
  Administered 2021-11-28: 150 ug via INTRAVENOUS

## 2021-11-28 MED ORDER — VANCOMYCIN HCL IN DEXTROSE 1-5 GM/200ML-% IV SOLN
1000.0000 mg | INTRAVENOUS | Status: AC
  Administered 2021-11-28: 1000 mg via INTRAVENOUS
  Filled 2021-11-28: qty 200

## 2021-11-28 MED ORDER — VANCOMYCIN HCL 1000 MG IV SOLR
INTRAVENOUS | Status: AC
  Filled 2021-11-28: qty 20

## 2021-11-28 MED ORDER — METHOCARBAMOL 500 MG PO TABS
500.0000 mg | ORAL_TABLET | Freq: Four times a day (QID) | ORAL | Status: DC | PRN
  Administered 2021-11-29 – 2021-12-06 (×16): 500 mg via ORAL
  Filled 2021-11-28 (×18): qty 1

## 2021-11-28 MED ORDER — THROMBIN 5000 UNITS EX SOLR
CUTANEOUS | Status: AC
  Filled 2021-11-28: qty 10000

## 2021-11-28 MED ORDER — ORAL CARE MOUTH RINSE: 15.0000 mL | Freq: Once | OROMUCOSAL | Status: DC

## 2021-11-28 MED ORDER — SODIUM CHLORIDE 0.9% FLUSH: 3.0000 mL | INTRAVENOUS | Status: DC | PRN

## 2021-11-28 MED ORDER — ROCURONIUM BROMIDE 10 MG/ML (PF) SYRINGE
PREFILLED_SYRINGE | INTRAVENOUS | Status: AC
  Filled 2021-11-28: qty 10

## 2021-11-28 MED ORDER — SODIUM CHLORIDE 0.9 % IV SOLN
1.0000 g | INTRAVENOUS | Status: DC
  Administered 2021-11-28: 1 g via INTRAVENOUS
  Filled 2021-11-28: qty 10

## 2021-11-28 MED ORDER — BUPIVACAINE HCL (PF) 0.25 % IJ SOLN
INTRAMUSCULAR | Status: AC
  Filled 2021-11-28: qty 30

## 2021-11-28 MED ORDER — OXYCODONE HCL 5 MG PO TABS
5.0000 mg | ORAL_TABLET | ORAL | Status: DC | PRN
  Administered 2021-11-29 – 2021-12-05 (×24): 5 mg via ORAL
  Filled 2021-11-28 (×29): qty 1

## 2021-11-28 MED ORDER — SODIUM CHLORIDE 0.9 % IV SOLN
1.0000 g | Freq: Once | INTRAVENOUS | Status: AC
  Administered 2021-11-28: 1 g via INTRAVENOUS
  Filled 2021-11-28: qty 10

## 2021-11-28 MED ORDER — SODIUM CHLORIDE 0.9% FLUSH
3.0000 mL | Freq: Two times a day (BID) | INTRAVENOUS | Status: DC
  Administered 2021-11-28 – 2021-12-05 (×9): 3 mL via INTRAVENOUS

## 2021-11-28 MED ORDER — MORPHINE SULFATE (PF) 2 MG/ML IV SOLN
2.0000 mg | INTRAVENOUS | Status: DC | PRN
  Administered 2021-11-28 – 2021-12-01 (×6): 2 mg via INTRAVENOUS
  Filled 2021-11-28 (×8): qty 1

## 2021-11-28 MED ORDER — HYDROMORPHONE HCL 1 MG/ML IJ SOLN
INTRAMUSCULAR | Status: DC | PRN
  Administered 2021-11-28: .5 mg via INTRAVENOUS

## 2021-11-28 MED ORDER — DEXTROSE 5 % IV SOLN: 1.0000 g | Freq: Once | INTRAVENOUS | Status: DC

## 2021-11-28 MED ORDER — VANCOMYCIN HCL 1000 MG IV SOLR: 1000.0000 mg | Freq: Two times a day (BID) | INTRAVENOUS | Status: DC

## 2021-11-28 MED ORDER — PROPOFOL 10 MG/ML IV BOLUS
INTRAVENOUS | Status: AC
  Filled 2021-11-28: qty 20

## 2021-11-28 MED ORDER — OXYCODONE HCL 5 MG PO TABS: 5.0000 mg | ORAL_TABLET | Freq: Once | ORAL | Status: DC | PRN

## 2021-11-28 MED ORDER — DEXAMETHASONE SODIUM PHOSPHATE 10 MG/ML IJ SOLN
INTRAMUSCULAR | Status: DC | PRN
Start: 2021-11-28 — End: 2021-11-28
  Administered 2021-11-28: 10 mg via INTRAVENOUS

## 2021-11-28 MED ORDER — KETOROLAC TROMETHAMINE 30 MG/ML IJ SOLN
INTRAMUSCULAR | Status: DC | PRN
  Administered 2021-11-28: 30 mg via INTRAVENOUS

## 2021-11-28 MED ORDER — MENTHOL 3 MG MT LOZG: 1.0000 | LOZENGE | OROMUCOSAL | Status: DC | PRN

## 2021-11-28 MED ORDER — LORAZEPAM 2 MG/ML IJ SOLN
1.0000 mg | Freq: Once | INTRAMUSCULAR | Status: AC
Start: 2021-11-28 — End: 2021-11-28
  Administered 2021-11-28: 1 mg via INTRAVENOUS
  Filled 2021-11-28: qty 1

## 2021-11-28 MED ORDER — ONDANSETRON HCL 4 MG/2ML IJ SOLN
INTRAMUSCULAR | Status: DC | PRN
Start: 2021-11-28 — End: 2021-11-28
  Administered 2021-11-28: 4 mg via INTRAVENOUS

## 2021-11-28 MED ORDER — FENTANYL CITRATE (PF) 100 MCG/2ML IJ SOLN
INTRAMUSCULAR | Status: DC | PRN
  Administered 2021-11-28: 100 ug via INTRAVENOUS
  Administered 2021-11-28: 150 ug via INTRAVENOUS

## 2021-11-28 MED ORDER — KETOROLAC TROMETHAMINE 30 MG/ML IJ SOLN
INTRAMUSCULAR | Status: AC
  Filled 2021-11-28: qty 1

## 2021-11-28 MED ORDER — PANTOPRAZOLE SODIUM 40 MG IV SOLR
40.0000 mg | Freq: Every day | INTRAVENOUS | Status: DC
  Administered 2021-11-28: 40 mg via INTRAVENOUS
  Filled 2021-11-28: qty 10

## 2021-11-28 SURGICAL SUPPLY — 46 items
BAG COUNTER SPONGE SURGICOUNT (BAG) ×2 IMPLANT
BAND RUBBER #18 3X1/16 STRL (MISCELLANEOUS) ×2 IMPLANT
BENZOIN TINCTURE PRP APPL 2/3 (GAUZE/BANDAGES/DRESSINGS) ×2 IMPLANT
BUR CARBIDE MATCH 3.0 (BURR) ×2 IMPLANT
CANISTER SUCT 3000ML PPV (MISCELLANEOUS) ×2 IMPLANT
DERMABOND ADHESIVE PROPEN (GAUZE/BANDAGES/DRESSINGS) ×1
DERMABOND ADVANCED .7 DNX6 (GAUZE/BANDAGES/DRESSINGS) IMPLANT
DRAPE C-ARM 42X72 X-RAY (DRAPES) ×2 IMPLANT
DRAPE LAPAROTOMY 100X72X124 (DRAPES) ×2 IMPLANT
DRAPE MICROSCOPE LEICA (MISCELLANEOUS) ×1 IMPLANT
DRAPE SURG 17X23 STRL (DRAPES) ×2 IMPLANT
DRSG OPSITE POSTOP 4X6 (GAUZE/BANDAGES/DRESSINGS) ×1 IMPLANT
DURAPREP 26ML APPLICATOR (WOUND CARE) ×2 IMPLANT
ELECT REM PT RETURN 9FT ADLT (ELECTROSURGICAL) ×2
ELECTRODE REM PT RTRN 9FT ADLT (ELECTROSURGICAL) ×1 IMPLANT
EVACUATOR 1/8 PVC DRAIN (DRAIN) ×1 IMPLANT
GAUZE 4X4 16PLY ~~LOC~~+RFID DBL (SPONGE) ×1 IMPLANT
GLOVE SURG ENC MOIS LTX SZ7 (GLOVE) ×1 IMPLANT
GLOVE SURG ENC MOIS LTX SZ8 (GLOVE) ×2 IMPLANT
GLOVE SURG UNDER POLY LF SZ7 (GLOVE) ×2 IMPLANT
GLOVE SURG UNDER POLY LF SZ7.5 (GLOVE) ×1 IMPLANT
GOWN STRL REUS W/ TWL LRG LVL3 (GOWN DISPOSABLE) IMPLANT
GOWN STRL REUS W/ TWL XL LVL3 (GOWN DISPOSABLE) ×1 IMPLANT
GOWN STRL REUS W/TWL 2XL LVL3 (GOWN DISPOSABLE) IMPLANT
GOWN STRL REUS W/TWL LRG LVL3 (GOWN DISPOSABLE) ×2
GOWN STRL REUS W/TWL XL LVL3 (GOWN DISPOSABLE) ×1
HEMOSTAT POWDER KIT SURGIFOAM (HEMOSTASIS) ×2 IMPLANT
KIT BASIN OR (CUSTOM PROCEDURE TRAY) ×2 IMPLANT
KIT TURNOVER KIT B (KITS) ×2 IMPLANT
NDL HYPO 25X1 1.5 SAFETY (NEEDLE) ×1 IMPLANT
NDL SPNL 20GX3.5 QUINCKE YW (NEEDLE) IMPLANT
NEEDLE HYPO 25X1 1.5 SAFETY (NEEDLE) ×2 IMPLANT
NEEDLE SPNL 20GX3.5 QUINCKE YW (NEEDLE) IMPLANT
NS IRRIG 1000ML POUR BTL (IV SOLUTION) ×2 IMPLANT
PACK LAMINECTOMY NEURO (CUSTOM PROCEDURE TRAY) ×2 IMPLANT
PAD ARMBOARD 7.5X6 YLW CONV (MISCELLANEOUS) ×5 IMPLANT
SPONGE SURGIFOAM ABS GEL SZ50 (HEMOSTASIS) ×1 IMPLANT
SPONGE T-LAP 4X18 ~~LOC~~+RFID (SPONGE) ×1 IMPLANT
STRIP CLOSURE SKIN 1/2X4 (GAUZE/BANDAGES/DRESSINGS) ×2 IMPLANT
SUT VIC AB 0 CT1 18XCR BRD8 (SUTURE) ×1 IMPLANT
SUT VIC AB 0 CT1 8-18 (SUTURE) ×1
SUT VIC AB 2-0 CP2 18 (SUTURE) ×2 IMPLANT
SUT VIC AB 3-0 SH 8-18 (SUTURE) ×3 IMPLANT
TOWEL GREEN STERILE (TOWEL DISPOSABLE) ×2 IMPLANT
TOWEL GREEN STERILE FF (TOWEL DISPOSABLE) ×2 IMPLANT
WATER STERILE IRR 1000ML POUR (IV SOLUTION) ×3 IMPLANT

## 2021-11-28 NOTE — Op Note (Signed)
11/28/2021 ? ?3:51 PM ? ?PATIENT:  Crystal Costa  26 y.o. female ? ?PRE-OPERATIVE DIAGNOSIS: Epidural abscess T5-T7 with paraplegia ? ?POST-OPERATIVE DIAGNOSIS:  same ? ?PROCEDURE: Thoracic laminectomy T5-T7 for evacuation of epidural abscess ? ?SURGEON:  Marikay Alar, MD ? ?ASSISTANTS: Verlin Dike FNP ? ?ANESTHESIA:   General ? ?EBL: Less than 100 ml ? ?Total I/O ?In: 1450 [I.V.:1000; IV Piggyback:450] ?Out: 1300 [Urine:1300] ? ?BLOOD ADMINISTERED: none ? ?DRAINS: Medium Hemovac ? ?SPECIMEN: Operative culture ? ?INDICATION FOR PROCEDURE: This patient presented with acute paraplegia of unknown duration. Imaging showed epidural abscess T5-T7.  She is a heroin abuser.  The patient tried conservative measures without relief. Pain was debilitating. Recommended rest laminectomy for evacuation of epidural abscess. Patient understood the risks, benefits, and alternatives and potential outcomes and wished to proceed. ? ?PROCEDURE DETAILS: The patient was taken to the operating room and after induction of adequate generalized endotracheal anesthesia, the patient was rolled into the prone position on the Wilson frame and all pressure points were padded.  The incision was marked utilizing AP fluoroscopy marking the pedicles from T5-T7.  The largest part of the abscess was at T6.  The left T6-7 facet joint was felt to be infected. the thoracic region was cleaned with Betadine scrub and then prepped with DuraPrep and draped in the usual sterile fashion. 8 cc of local anesthesia was injected and then a dorsal midline incision was made and carried down to the thoracic fascia. The fascia was opened and the paraspinous musculature was taken down in a subperiosteal fashion to expose T5-T7 bilaterally.  There was obvious pus around the T6-7 joint on the left which also confirmed our level.  We sent a intraoperative culture from this facet joint.  Intraoperative x-ray confirmed my level, and then I used a Leksell rongeur to remove the  spinous processes of T5-T6 and T7 and then used a combination of the high-speed drill and the Kerrison punches to perform a laminectomy, medial facetectomy, and foraminotomy at T5, 6, and T7 bilaterally. The underlying yellow ligament was opened and removed in a piecemeal fashion to expose the underlying dura.  Large amounts of pus came from the epidural space at T6-7 on the left.  We sent further cultures here.  I undercut the lateral recess at T5-T6 and T7 bilaterally.  There was epidural phlegmon and epidural fat which was removed with a nerve hook to return rongeur and suction.  The dura was pulsatile and we got done. I irrigated with saline solution. Achieved hemostasis with bipolar cautery and Surgifoam, lined the dura with Gelfoam vancomycin powder, laced a medium Hemovac drain through a separate stab incision, and then closed the fascia with 0 Vicryl. I closed the subcutaneous tissues with 2-0 Vicryl and the subcuticular tissues with 3-0 Vicryl. The skin was then closed with benzoin and Steri-Strips. The drapes were removed, a sterile dressing was applied.  My nurse practitioner was involved in the exposure, the laminectomy, the removal of the abscess, the culture, and the closure. the patient was awakened from general anesthesia and transferred to the recovery room in stable condition. At the end of the procedure all sponge, needle and instrument counts were correct. ? ? ? ?PLAN OF CARE: Admit to inpatient  ? ?PATIENT DISPOSITION:  PACU - hemodynamically stable. ?  ?Delay start of Pharmacological VTE agent (>24hrs) due to surgical blood loss or risk of bleeding:  yes ? ? ? ? ? ? ? ? ? ? ? ? ? ? ?

## 2021-11-28 NOTE — OR Nursing (Signed)
Foley catheter un -clamped by CRNA AT [id number] ?

## 2021-11-28 NOTE — ED Notes (Signed)
Pt still in MRI at this time unable to update vitals  ?

## 2021-11-28 NOTE — ED Notes (Signed)
Patient transported to MRI 

## 2021-11-28 NOTE — ED Provider Notes (Signed)
?  Physical Exam  ?BP 125/79   Pulse 98   Temp 98.7 ?F (37.1 ?C) (Oral)   Resp 18   Ht 5' (1.524 m)   Wt 72.6 kg   LMP 10/30/2021 (Approximate)   SpO2 97%   BMI 31.25 kg/m?  ? ?Physical Exam ? ?Procedures  ?Procedures ? ?ED Course / MDM  ?  ?Medical Decision Making ?Amount and/or Complexity of Data Reviewed ?Independent Historian:  ?   Details: boyfriend ?Labs: ordered. Decision-making details documented in ED Course. ?   Details: leukocytosis. ?Radiology: ordered and independent interpretation performed. Decision-making details documented in ED Course. ?   Details: MR shows thoracic epidural abscess ? ?Risk ?Prescription drug management. ?Decision regarding hospitalization. ?Emergency major surgery. ? ?Critical Care ?Total time providing critical care: 30 minutes ? ?Received patient in signout.  Paralysis from about a T8 level down.  Also no sensation.  Afebrile but did have a leukocytosis.  After later discussion with patient she will admit some IV drug use.Most of the history would come from her boyfriend since she would only say occasional statements.  Unsure if she is using opiates or methamphetamine.  MRI of the thoracic spine shows epidural abscess with cord compression.  Will discuss with neurosurgery.  Have added now blood cultures acetaminophen and HIV testing. ? ?Dr. Yetta Barre will take emergently to OR. ? ? ? ? ?  ?Benjiman Core, MD ?11/29/21 [id number] ? ?

## 2021-11-28 NOTE — Anesthesia Preprocedure Evaluation (Addendum)
Anesthesia Evaluation  ?Patient identified by MRN, date of birth, ID band ?Patient awake ? ? ? ?Reviewed: ?Allergy & Precautions, NPO status , Patient's Chart, lab work & pertinent test results ? ?Airway ?Mallampati: III ? ?TM Distance: >3 FB ?Neck ROM: Full ? ? ? Dental ? ?(+) Poor Dentition, Chipped, Missing ?  ?Pulmonary ?neg pulmonary ROS,  ?  ?Pulmonary exam normal ? ? ? ? ? ? ? Cardiovascular ?negative cardio ROS ? ? ?Rhythm:Regular Rate:Tachycardia ? ? ?  ?Neuro/Psych ?Mid-back pain, neuro deficit. Bilateral lower ?extremity numbness beginning 2 days ago with proximal progression to the T8 level. Bilateral leg weakness ?  ? GI/Hepatic ?negative GI ROS, (+)  ?  ? substance abuse ? IV drug use,   ?Endo/Other  ?negative endocrine ROS ? Renal/GU ?negative Renal ROS  ? ?  ?Musculoskeletal ? ? Abdominal ?(+) + obese,   ?Peds ? Hematology ?negative hematology ROS ?(+)   ?Anesthesia Other Findings ?Epidural abscess with paraplegia ? Reproductive/Obstetrics ?hcg negative ? ?  ? ? ? ? ? ? ? ? ? ? ? ? ? ?  ?  ? ? ? ? ? ? ? ?Anesthesia Physical ?Anesthesia Plan ? ?ASA: 4 and emergent ? ?Anesthesia Plan: General  ? ?Post-op Pain Management:   ? ?Induction: Intravenous ? ?PONV Risk Score and Plan: 3 and Ondansetron, Dexamethasone, Midazolam and Treatment may vary due to age or medical condition ? ?Airway Management Planned: Oral ETT ? ?Additional Equipment:  ? ?Intra-op Plan:  ? ?Post-operative Plan: Extubation in OR ? ?Informed Consent: I have reviewed the patients History and Physical, chart, labs and discussed the procedure including the risks, benefits and alternatives for the proposed anesthesia with the patient or authorized representative who has indicated his/her understanding and acceptance.  ? ? ? ?Dental advisory given ? ?Plan Discussed with: CRNA ? ?Anesthesia Plan Comments:   ? ? ? ? ? ?Anesthesia Quick Evaluation ? ?

## 2021-11-28 NOTE — ED Triage Notes (Signed)
BLE numbness and weakness started 2 days ago and is spreading up to mid abdomen. Pt denies injury. Does endorse migraine HA 3 days ago before numbness. Pt can not walk since numbness started, does ambulate at baseline ?

## 2021-11-28 NOTE — Consult Note (Signed)
Reason for Consult: Epidural abscess ?Referring Physician: EDP ? ?Crystal Costa is an 26 y.o. female.  ? ?HPI:  ?26 year old female seen in neurosurgical consultation regarding a thoracic epidural abscess.  She is a poor historian.  She cannot give reliable answers to difficult questions.  She states that she uses heroin.  She started having back pain in the last few days and then tingling in her feet and then inability to walk.  She has been here for 8 hours but it sounds like her symptoms may have started as long as 2 days ago.  Neither she nor her boyfriend can give [place] a good timeframe.  MRI was done in the emergency department that showed a dorsal fluid collection behind T6 and neurosurgical evaluation was requested. ? ?History reviewed. No pertinent past medical history.  ?History reviewed. No pertinent surgical history.  ?No Known Allergies  ?Social History  ? ?Tobacco Use  ? Smoking status: Not on file  ? Smokeless tobacco: Not on file  ?Substance Use Topics  ? Alcohol use: Not on file  ?  ?History reviewed. No pertinent family history. ?  ?Review of Systems ? ?Positive ROS: Unable to adequately obtain ? ?All other systems have been reviewed and were otherwise negative with the exception of those mentioned in the HPI and as above. ? ?Objective: ?Vital signs in last 24 hours: ?Temp:  [98.7 ?F (37.1 ?C)] 98.7 ?F (37.1 ?C) (04/03 0503) ?Pulse Rate:  [88-98] 98 (04/03 1153) ?Resp:  [16-18] 18 (04/03 1153) ?BP: (110-129)/(58-81) 125/79 (04/03 1153) ?SpO2:  [96 %-100 %] 97 % (04/03 1153) ?Weight:  [72.6 kg] 72.6 kg (04/03 0504) ? ?General Appearance: Lethargic but cooperative, no distress, appears stated age ?Head: Normocephalic, without obvious abnormality, atraumatic ?Eyes: PERRL, conjunctiva/corneas clear, EOM's intact     ?Neck: Supple ?Back: Symmetric, no curvature, ROM normal, no CVA tenderness ?Lungs: respirations unlabored ?Heart: Regular rate and rhythm ?Abdomen: Soft ?Extremities: Extremities normal,  atraumatic, no cyanosis or edema ?Pulses: 2+ and symmetric all extremities ?Skin: Skin color, texture, turgor normal, no rashes or lesions, multiple tattoos ? ?NEUROLOGIC:  ? ?Mental status: Lethargic &O x4, no aphasia, decreased attention span, Memory and fund of knowledge unable to fully test ?Motor Exam - grossly normal, normal tone and bulk in the upper extremities with complete paraplegia of the lower extremities with decreased tone ?Sensory Exam - grossly normal the upper extremities but absent in the lower extremities except may be some neuropathic pain in the left lower extremity ?Reflexes: Flaccid lower extremities ?Coordination - grossly normal in upper extremities ?Gait -unable to test ?Balance -unable to test ?Cranial Nerves: ?I: smell Not tested  ?II: visual acuity  OS: na    OD: na  ?II: visual fields Full to confrontation  ?II: pupils Equal, round, reactive to light  ?III,VII: ptosis None  ?III,IV,VI: extraocular muscles  Full ROM  ?V: mastication Normal  ?V: facial light touch sensation  Normal  ?V,VII: corneal reflex  Present  ?VII: facial muscle function - upper  Normal  ?VII: facial muscle function - lower Normal  ?VIII: hearing Not tested  ?IX: soft palate elevation  Normal  ?IX,X: gag reflex Present  ?XI: trapezius strength  5/5  ?XI: sternocleidomastoid strength 5/5  ?XI: neck flexion strength  5/5  ?XII: tongue strength  Normal  ? ? ?Data Review ?Lab Results  ?Component Value Date  ? WBC 24.6 (H) 11/28/2021  ? HGB 12.0 11/28/2021  ? HCT 35.5 (L) 11/28/2021  ? MCV 86.4 11/28/2021  ?  PLT 450 (H) 11/28/2021  ? ?Lab Results  ?Component Value Date  ? NA 130 (L) 11/28/2021  ? K 4.7 11/28/2021  ? CL 96 (L) 11/28/2021  ? CO2 25 11/28/2021  ? BUN 7 11/28/2021  ? CREATININE 0.51 11/28/2021  ? GLUCOSE 104 (H) 11/28/2021  ? ?No results found for: INR, PROTIME ? ?Radiology: MR Brain W and Wo Contrast ? ?Result Date: 11/28/2021 ?CLINICAL DATA:  Motor neuron disease suspected. Bilateral lower extremity  numbness beginning 2 days ago with proximal progression to the T8 level. Bilateral leg weakness. EXAM: MRI HEAD WITHOUT AND WITH CONTRAST TECHNIQUE: Multiplanar, multiecho pulse sequences of the brain and surrounding structures were obtained without and with intravenous contrast. CONTRAST:  7.705mL GADAVIST GADOBUTROL 1 MMOL/ML IV SOLN COMPARISON:  Head CT 12/26/2017 FINDINGS: The study is moderately motion degraded. Brain: There is no evidence of an acute infarct, intracranial hemorrhage, mass, midline shift, or extra-axial fluid collection. The ventricles and sulci are normal. No brain parenchymal signal abnormality or abnormal enhancement is identified within limitations of motion artifact. Vascular: Major intracranial vascular flow voids are preserved. Skull and upper cervical spine: No suspicious marrow lesion. Sinuses/Orbits: Unremarkable orbits. Paranasal sinuses and mastoid air cells are clear. Other: None. IMPRESSION: Negative brain MRI within limitations of motion artifact. Electronically Signed   By: Sebastian AcheAllen  Grady M.D.   On: 11/28/2021 11:58  ? ?MR Cervical Spine W or Wo Contrast ? ?Result Date: 11/28/2021 ?CLINICAL DATA:  Bilateral lower extremity numbness beginning 2 days ago with proximal progression to the T8 level. Bilateral leg weakness. EXAM: MRI CERVICAL SPINE WITHOUT AND WITH CONTRAST TECHNIQUE: Multiplanar and multiecho pulse sequences of the cervical spine, to include the craniocervical junction and cervicothoracic junction, were obtained without and with intravenous contrast. CONTRAST:  7.195mL GADAVIST GADOBUTROL 1 MMOL/ML IV SOLN COMPARISON:  None. FINDINGS: The study is motion degraded, including moderate to severe motion artifact on multiple axial sequences. Alignment: Normal. Vertebrae: Diffusely diminished bone marrow T1 signal intensity, nonspecific but can be seen with anemia, smoking, and obesity. No suspicious focal marrow lesion, fracture, significant marrow edema, or evidence of discitis.  Cord: Normal signal and morphology. Posterior Fossa, vertebral arteries, paraspinal tissues: Unremarkable. Disc levels: Preserved disc height and hydration throughout the cervical spine. No sizable disc herniation or stenosis. IMPRESSION: Motion degraded examination. No disc herniation or stenosis. Normal appearance of the cervical spinal cord. Electronically Signed   By: Sebastian AcheAllen  Grady M.D.   On: 11/28/2021 12:04  ? ?MR THORACIC SPINE W WO CONTRAST ? ?Result Date: 11/28/2021 ?CLINICAL DATA:  Mid-back pain, neuro deficit. Bilateral lower extremity numbness beginning 2 days ago with proximal progression to the T8 level. Bilateral leg weakness. EXAM: MRI THORACIC WITHOUT AND WITH CONTRAST TECHNIQUE: Multiplanar and multiecho pulse sequences of the thoracic spine were obtained without and with intravenous contrast. CONTRAST:  7.575mL GADAVIST GADOBUTROL 1 MMOL/ML IV SOLN COMPARISON:  None. FINDINGS: Alignment:  Mild lower thoracic levoscoliosis.  No listhesis. Vertebrae: Diffusely diminished bone marrow T1 signal intensity, nonspecific though can be seen with anemia, smoking, and obesity. No suspicious focal marrow lesion, fracture, or evidence of discitis. Multiple small Schmorl's nodes in the lower thoracic spine. Fluid in the left T6-7 facet joint with associated mild marrow edema and enhancement and more prominent edema and enhancement in the adjacent paraspinal soft tissues. Abnormal T2 hyperintense dorsal epidural material in the spinal canal extending from T3-T8, greatest from T5-T7 where it measures up to 8 mm in AP thickness. This epidural material largely enhances, however  there are a few subcentimeter nonenhancing foci at T6. Cord: The above described epidural process results in severe cord compression at T6, and cord edema is suspected at T6-7. Paraspinal and other soft tissues: Left-sided paraspinal soft tissue inflammation as noted above, extending superiorly greater than inferiorly from the T6-7 facet joint. 1  cm nonenhancing fluid collection posterosuperior to the left T6-7 facet joint. Partially visualized mild bilateral hydronephrosis, right greater than left. Trace left pleural effusion. Disc levels: Severe spina

## 2021-11-28 NOTE — Plan of Care (Signed)
  Problem: Education: Goal: Knowledge of General Education information will improve Description: Including pain rating scale, medication(s)/side effects and non-pharmacologic comfort measures Outcome: Progressing   Problem: Health Behavior/Discharge Planning: Goal: Ability to manage health-related needs will improve Outcome: Progressing   Problem: Clinical Measurements: Goal: Ability to maintain clinical measurements within normal limits will improve Outcome: Progressing Goal: Will remain free from infection Outcome: Progressing Goal: Diagnostic test results will improve Outcome: Progressing Goal: Respiratory complications will improve Outcome: Progressing Goal: Cardiovascular complication will be avoided Outcome: Progressing   Problem: Nutrition: Goal: Adequate nutrition will be maintained Outcome: Progressing   Problem: Coping: Goal: Level of anxiety will decrease Outcome: Progressing   Problem: Elimination: Goal: Will not experience complications related to bowel motility Outcome: Progressing   Problem: Pain Managment: Goal: General experience of comfort will improve Outcome: Progressing   Problem: Safety: Goal: Ability to remain free from injury will improve Outcome: Progressing   Problem: Skin Integrity: Goal: Risk for impaired skin integrity will decrease Outcome: Progressing   

## 2021-11-28 NOTE — Progress Notes (Signed)
Pharmacy Antibiotic Note ? ?Crystal Costa is a 26 y.o. female admitted on 11/28/2021 with  epidural abscess .  Pharmacy has been consulted for vanc  dosing. ? ?Pt with hx IVDU who presented with tingling in feet and difficulty walking. MRI showed epidural abscess. Underwent emergent drainage and laminectomy. Empiric vanc/ceftriaxone.  ? ?Scr 0.51 ? ?Plan: ?Vanc 1g (OR) then 750mg  IV BID>>AUC 423, scr 0.8 ?Increase ceftriaxone to 2g IV q24 ?Levels as needed ? ?Height: 5' (152.4 cm) ?Weight: 72.6 kg (160 lb) ?IBW/kg (Calculated) : 45.5 ? ?Temp (24hrs), Avg:98.4 ?F (36.9 ?C), Min:97.2 ?F (36.2 ?C), Max:99.4 ?F (37.4 ?C) ? ?Recent Labs  ?Lab 11/28/21 ?0748  ?WBC 24.6*  ?CREATININE 0.51  ?  ?Estimated Creatinine Clearance: 93.9 mL/min (by C-G formula based on SCr of 0.51 mg/dL).   ? ?No Known Allergies ? ?Antimicrobials this admission: ?4/3 vanc>> ?4/3 ceftriaxone>> ? ?Dose adjustments this admission: ? ? ?Microbiology results: ?MRSA PCR+ ?4/3 thoracic tissue>> ?4/3 blood>> ? ?Onnie Boer, PharmD, BCIDP, AAHIVP, CPP ?Infectious Disease Pharmacist ?11/28/2021 6:15 PM ? ? ?

## 2021-11-28 NOTE — ED Notes (Signed)
Pt remains in MRI 

## 2021-11-28 NOTE — Anesthesia Procedure Notes (Signed)
Procedure Name: Intubation ?Date/Time: 11/28/2021 2:02 PM ?Performed by: Marena Chancy, CRNA ?Pre-anesthesia Checklist: Patient identified, Emergency Drugs available, Suction available and Patient being monitored ?Patient Re-evaluated:Patient Re-evaluated prior to induction ?Oxygen Delivery Method: Circle System Utilized ?Preoxygenation: Pre-oxygenation with 100% oxygen ?Induction Type: IV induction ?Ventilation: Mask ventilation without difficulty ?Laryngoscope Size: Hyacinth Meeker and 2 ?Grade View: Grade I ?Tube type: Oral ?Tube size: 7.0 mm ?Number of attempts: 1 ?Airway Equipment and Method: Stylet and Oral airway ?Placement Confirmation: ETT inserted through vocal cords under direct vision, positive ETCO2 and breath sounds checked- equal and bilateral ?Tube secured with: Tape ?Dental Injury: Teeth and Oropharynx as per pre-operative assessment  ? ? ? ? ?

## 2021-11-28 NOTE — ED Provider Notes (Signed)
?[facility] EMERGENCY DEPARTMENT ?Provider Note ? ? ?CSN: [id number] ?Arrival date & time: 11/28/21  0457 ? ?  ? ?History ? ?Chief Complaint  ?Patient presents with  ? Numbness  ?  Numbness tingling BLE stared 2 days ago and has moved up to middle of abdomen. Pt is no longer able to ambulate due to numbness. Pulses weak in both feet and lower extremities are cold to touch  ? ? ?Crystal Costa is a 26 y.o. female. ? ?The history is provided by the patient.  ?She had onset yesterday of numbness in her feet which is spreading up and now has gotten to about the mid back/abdomen.  She initially was weak in her legs, now unable to stand.  She is able to urinate, but has to push hard to get the urine out.  There has been no incontinence.  She denies any history of trauma.  She denies fever or chills.  She is starting to get a headache now, but did not have a headache at any other point during this illness. ?  ?Home Medications ?Prior to Admission medications   ?Not on File  ?   ? ?Allergies    ?Patient has no known allergies.   ? ?Review of Systems   ?Review of Systems  ?All other systems reviewed and are negative. ? ?Physical Exam ?Updated Vital Signs ?BP 114/71   Pulse 88   Temp 98.7 ?F (37.1 ?C) (Oral)   Resp 18   Ht 5' (1.524 m)   Wt 72.6 kg   LMP 10/30/2021 (Approximate)   SpO2 100%   BMI 31.25 kg/m?  ?Physical Exam ?Vitals and nursing note reviewed.  ?26 year old female, resting comfortably and in no acute distress. Vital signs are normal. Oxygen saturation is 100%, which is normal. ?Head is normocephalic and atraumatic. PERRLA, EOMI. Oropharynx is clear. ?Neck is nontender and supple without adenopathy or JVD. ?Back is nontender and there is no CVA tenderness. ?Lungs are clear without rales, wheezes, or rhonchi. ?Chest is nontender. ?Heart has regular rate and rhythm without murmur. ?Abdomen is soft, flat, nontender without masses or hepatosplenomegaly and peristalsis is  normoactive. ?Extremities have no cyanosis or edema. ?Skin is warm and dry without rash. ?Neurologic: Awake and alert.  Oriented x3.  Cranial nerves are intact.  Strength is 5/5 in both arms, 0/5 in both legs.  Deep tendon reflexes are absent in the legs.  Take normal sensation is present in the arms, chest, upper back.  Normal sensation begins at approximately the costal margin, corresponding to approximately at T8 level.  On plantar stimulation, there is slight upward movement of the right toe, none of the left toe. ? ?ED Results / Procedures / Treatments   ?Labs ?(all labs ordered are listed, but only abnormal results are displayed) ?Labs Reviewed  ?COMPREHENSIVE METABOLIC PANEL  ?CBC WITH DIFFERENTIAL/PLATELET  ?URINALYSIS, ROUTINE W REFLEX MICROSCOPIC  ?RAPID URINE DRUG SCREEN, HOSP PERFORMED  ?I-STAT BETA HCG BLOOD, ED ([facility], [facility], [facility] ONLY)  ? ?Radiology ?No results found. ? ?Procedures ?Procedures  ? ? ?Medications Ordered in ED ?Medications  ?LORazepam (ATIVAN) injection 1 mg (has no administration in time range)  ? ? ?ED Course/ Medical Decision Making/ A&P ?  ?                        ?Medical Decision Making ?Amount and/or Complexity of Data Reviewed ?Labs: ordered. ?Radiology: ordered. ? ?Risk ?Prescription drug management. ? ? ?Bilateral  leg weakness and numbness with sensory level corresponding to approximately T8.  With symptoms having started distally and progressing proximally, I am concerned about Guillain-Barr? syndrome.  Careful exam showed no presence of tick to indicate tick paralysis.  Consider demyelinating conditions such as multiple sclerosis.  Consider spinal cord compression although no history of trauma to precipitate it.  She will be sent for MRI scans of cervical and thoracic spine as well as brain, screening labs obtained. ? ?Labs and MRI scans are still pending.  Case is signed out to Dr. Rubin Payor. ? ?CRITICAL CARE ?Performed by: Dione Booze ?Total critical care time: 45  minutes ?Critical care time was exclusive of separately billable procedures and treating other patients. ?Critical care was necessary to treat or prevent imminent or life-threatening deterioration. ?Critical care was time spent personally by me on the following activities: development of treatment plan with patient and/or surrogate as well as nursing, discussions with consultants, evaluation of patient's response to treatment, examination of patient, obtaining history from patient or surrogate, ordering and performing treatments and interventions, ordering and review of laboratory studies, ordering and review of radiographic studies, pulse oximetry and re-evaluation of patient's condition. ? ?Final Clinical Impression(s) / ED Diagnoses ?Final diagnoses:  ?Paresthesia  ?Leg weakness, bilateral  ? ? ?Rx / DC Orders ?ED Discharge Orders   ? ? None  ? ?  ? ? ?  ?Dione Booze, MD ?11/28/21 [phone] ? ?

## 2021-11-28 NOTE — Transfer of Care (Signed)
Immediate Anesthesia Transfer of Care Note ? ?Patient: Crystal Costa ? ?Procedure(s) Performed: THORACIC LAMINECTOMY FOR EPIDURAL ABSCESS Thoraci five-Thoracic six, Thoracic six-Thoracic seven ? ?Patient Location: PACU ? ?Anesthesia Type:General ? ?Level of Consciousness: drowsy ? ?Airway & Oxygen Therapy: Patient Spontanous Breathing and Patient connected to face mask oxygen ? ?Post-op Assessment: Report given to RN and Post -op Vital signs reviewed and stable ? ?Post vital signs: Reviewed and stable ? ?Last Vitals:  ?Vitals Value Taken Time  ?BP 110/67 11/28/21 1601  ?Temp    ?Pulse 83 11/28/21 1603  ?Resp 15 11/28/21 1603  ?SpO2 100 % 11/28/21 1603  ?Vitals shown include unvalidated device data. ? ?Last Pain:  ?Vitals:  ? 11/28/21 1329  ?TempSrc: Oral  ?PainSc:   ?   ? ?  ? ?Complications: No notable events documented. ?

## 2021-11-28 NOTE — ED Notes (Signed)
Pt returned from MRI. Attempting to provide urine sample.  ?

## 2021-11-29 ENCOUNTER — Encounter (HOSPITAL_COMMUNITY): Payer: Self-pay | Admitting: Neurological Surgery

## 2021-11-29 ENCOUNTER — Other Ambulatory Visit: Payer: Self-pay

## 2021-11-29 ENCOUNTER — Inpatient Hospital Stay (HOSPITAL_COMMUNITY): Payer: Medicaid Other

## 2021-11-29 DIAGNOSIS — R7881 Bacteremia: Secondary | ICD-10-CM

## 2021-11-29 DIAGNOSIS — B9561 Methicillin susceptible Staphylococcus aureus infection as the cause of diseases classified elsewhere: Secondary | ICD-10-CM

## 2021-11-29 DIAGNOSIS — B182 Chronic viral hepatitis C: Secondary | ICD-10-CM

## 2021-11-29 DIAGNOSIS — A4101 Sepsis due to Methicillin susceptible Staphylococcus aureus: Secondary | ICD-10-CM

## 2021-11-29 DIAGNOSIS — F1990 Other psychoactive substance use, unspecified, uncomplicated: Secondary | ICD-10-CM

## 2021-11-29 DIAGNOSIS — G8220 Paraplegia, unspecified: Secondary | ICD-10-CM

## 2021-11-29 DIAGNOSIS — G062 Extradural and subdural abscess, unspecified: Secondary | ICD-10-CM

## 2021-11-29 LAB — BLOOD CULTURE ID PANEL (REFLEXED) - BCID2

## 2021-11-29 LAB — BASIC METABOLIC PANEL
Anion gap: 10 (ref 5–15)
BUN: 15 mg/dL (ref 6–20)
CO2: 23 mmol/L (ref 22–32)
Calcium: 8.6 mg/dL — ABNORMAL LOW (ref 8.9–10.3)
Chloride: 103 mmol/L (ref 98–111)
Creatinine, Ser: 0.71 mg/dL (ref 0.44–1.00)
GFR, Estimated: >60 mL/min (ref >60)
Glucose, Bld: 190 mg/dL — ABNORMAL HIGH (ref 70–99)
Potassium: 5 mmol/L (ref 3.5–5.1)
Sodium: 136 mmol/L (ref 135–145)

## 2021-11-29 LAB — CBC
HCT: 33.4 % — ABNORMAL LOW (ref 36.0–46.0)
Hemoglobin: 11.1 g/dL — ABNORMAL LOW (ref 12.0–15.0)
MCH: 28.9 pg (ref 26.0–34.0)
MCHC: 33.2 g/dL (ref 30.0–36.0)
MCV: 87 fL (ref 80.0–100.0)
Platelets: 443 10*3/uL — ABNORMAL HIGH (ref 150–400)
RBC: 3.84 MIL/uL — ABNORMAL LOW (ref 3.87–5.11)
RDW: 13.1 % (ref 11.5–15.5)
WBC: 23.3 10*3/uL — ABNORMAL HIGH (ref 4.0–10.5)
nRBC: 0 % (ref 0.0–0.2)

## 2021-11-29 LAB — ECHOCARDIOGRAM COMPLETE
AR max vel: 2.97 cm2
AV Area VTI: 2.89 cm2
AV Area mean vel: 2.64 cm2
AV Mean grad: 7 mmHg
AV Peak grad: 13.5 mmHg
Ao pk vel: 1.84 m/s
Area-P 1/2: 3.91 cm2
Height: 60 in
S' Lateral: 3.1 cm
Weight: 2560 oz

## 2021-11-29 MED ORDER — PANTOPRAZOLE SODIUM 40 MG PO TBEC
40.0000 mg | DELAYED_RELEASE_TABLET | ORAL | Status: DC
  Administered 2021-11-29 – 2021-12-06 (×8): 40 mg via ORAL
  Filled 2021-11-29 (×8): qty 1

## 2021-11-29 MED ORDER — GABAPENTIN 300 MG PO CAPS
300.0000 mg | ORAL_CAPSULE | Freq: Three times a day (TID) | ORAL | Status: DC
  Administered 2021-11-29 – 2021-12-07 (×24): 300 mg via ORAL
  Filled 2021-11-29 (×25): qty 1

## 2021-11-29 MED ORDER — CHLORHEXIDINE GLUCONATE CLOTH 2 % EX PADS
6.0000 | MEDICATED_PAD | Freq: Every day | CUTANEOUS | Status: DC
  Administered 2021-11-29 – 2021-12-07 (×9): 6 via TOPICAL

## 2021-11-29 MED ORDER — CEFAZOLIN SODIUM-DEXTROSE 2-4 GM/100ML-% IV SOLN
2.0000 g | Freq: Three times a day (TID) | INTRAVENOUS | Status: DC
Start: 2021-11-29 — End: 2021-12-07
  Administered 2021-11-29 – 2021-12-07 (×23): 2 g via INTRAVENOUS
  Filled 2021-11-29 (×24): qty 100

## 2021-11-29 NOTE — Plan of Care (Signed)
?  Problem: Elimination: ?Goal: Will not experience complications related to bowel motility ?Outcome: Progressing ?  ?Problem: Pain Managment: ?Goal: General experience of comfort will improve ?Outcome: Progressing ?  ?Problem: Safety: ?Goal: Ability to remain free from injury will improve ?Outcome: Progressing ?  ?

## 2021-11-29 NOTE — Progress Notes (Signed)
Inpatient Rehab Admissions Coordinator:  ? ?Consult received and chart reviewed.  Note therapy evaluations pending.  Also note will likely need IV abx (vanc/rocephin) for epidural abscess.  Due to history of IVDU would not be a candidate for CIR with need for long-term IV abx.  If she could reasonably transition to PO abx, we could consider.   ? ?Estill Dooms, PT, DPT ?Admissions Coordinator ?[phone] ?11/29/21  ?10:56 AM ? ?

## 2021-11-29 NOTE — Plan of Care (Signed)

## 2021-11-29 NOTE — Plan of Care (Signed)
  Problem: Nutrition: Goal: Adequate nutrition will be maintained Outcome: Progressing   Problem: Coping: Goal: Level of anxiety will decrease Outcome: Progressing   Problem: Pain Managment: Goal: General experience of comfort will improve Outcome: Progressing   

## 2021-11-29 NOTE — Progress Notes (Signed)
Echocardiogram ?2D Echocardiogram has been performed. ? ?Crystal Costa ?11/29/2021, 1:37 PM ?

## 2021-11-29 NOTE — Evaluation (Addendum)
Physical Therapy Evaluation ?Patient Details ?Name: Crystal Costa ?MRN: [id number] ?DOB: December 04, 1995 ?Today's Date: 11/29/2021 ? ?History of Present Illness ? Pt is a 26 y.o. F who presents with acute paraplegia of unknown duration. Imaging showed epidural abscess T5-7. She states she does use heroin. No other significant PMH on file.  ?Clinical Impression ? PTA, pt lives with her boyfriend's mother and does not work. Pt reporting no active movement of bilateral lower extremities. Upon assessment, range of motion WFL and no tone noted. Pt reports burning pain in bilateral BLE's and has some bowel sensation. Pt requiring maximal assist for rolling multiple times and two person maximal assist for bed mobility. Tolerated ~5 minutes static sitting with mod-max assist. Has good activation of biceps/triceps to assist with external support. Pt will need intensive acute rehabilitation for bed mobility, transfer training, balance, strengthening, and ADL's.   ?   ? ?Recommendations for follow up therapy are one component of a multi-disciplinary discharge planning process, led by the attending physician.  Recommendations may be updated based on patient status, additional functional criteria and insurance authorization. ? ?Follow Up Recommendations Acute inpatient rehab (3hours/day) ? ?  ?Assistance Recommended at Discharge Frequent or constant Supervision/Assistance  ?Patient can return home with the following ? Two people to help with walking and/or transfers;A lot of help with bathing/dressing/bathroom;Assistance with cooking/housework;Assist for transportation;Help with stairs or ramp for entrance ? ?  ?Equipment Recommendations Wheelchair (measurements PT);Wheelchair cushion (measurements PT);Hospital bed;BSC/3in1;Other (comment) (hoyer lift)  ?Recommendations for Other Services ? Rehab consult  ?  ?Functional Status Assessment Patient has had a recent decline in their functional status and demonstrates the ability to make  significant improvements in function in a reasonable and predictable amount of time.  ? ?  ?Precautions / Restrictions Precautions ?Precautions: Fall;Other (comment) ?Precaution Comments: hemovac, paraplegia ?Restrictions ?Weight Bearing Restrictions: No  ? ?  ? ?Mobility ? Bed Mobility ?Overal bed mobility: Needs Assistance ?Bed Mobility: Rolling, Supine to Sit, Sit to Supine ?Rolling: Max assist ?  ?Supine to sit: Max assist, +2 for physical assistance ?Sit to supine: Max assist, +2 for physical assistance ?  ?General bed mobility comments: Pt requiring maxA for rolling multiple times to R/L for peri care, pad change and positioning. Assist for bending contralateral knees, consistent cues for reaching for railing and gripping to assist. MaxA + 2 for supine <> sit with assist for BLE's and trunk ?  ? ?Transfers ?  ?  ?  ?  ?  ?  ?  ?  ?  ?General transfer comment: deferred due to pain ?  ? ?Ambulation/Gait ?  ?  ?  ?  ?  ?  ?  ?  ? ?Stairs ?  ?  ?  ?  ?  ? ?Wheelchair Mobility ?  ? ?Modified Rankin (Stroke Patients Only) ?  ? ?  ? ?Balance Overall balance assessment: Needs assistance ?Sitting-balance support: Feet supported ?Sitting balance-Leahy Scale: Poor ?Sitting balance - Comments: Requiring mod-maxA for sitting balance, BUE support, static sitting ~5 minutes ?  ?  ?  ?  ?  ?  ?  ?  ?  ?  ?  ?  ?  ?  ?  ?   ? ? ? ?Pertinent Vitals/Pain Pain Assessment ?Pain Assessment: Faces ?Faces Pain Scale: Hurts whole lot ?Pain Location: BLE's ?Pain Descriptors / Indicators: Burning ?Pain Intervention(s): Limited activity within patient's tolerance, Monitored during session, Repositioned  ? ? ?Home Living Family/patient expects to be discharged to:: Private residence ?  Living Arrangements: Other relatives (boyfriend's mother's) ?Available Help at Discharge: Friend(s) ?Type of Home: House ?Home Access: Stairs to enter ?Entrance Stairs-Rails: Right;Left ?Entrance Stairs-Number of Steps: 3 ?  ?Home Layout: One level ?  ?   ?   ?Prior Function Prior Level of Function : Independent/Modified Independent ?  ?  ?  ?  ?  ?  ?Mobility Comments: does not work ?  ?  ? ? ?Hand Dominance  ? Dominant Hand: Right ? ?  ?Extremity/Trunk Assessment  ? Upper Extremity Assessment ?Upper Extremity Assessment: Defer to OT evaluation ?  ? ?Lower Extremity Assessment ?Lower Extremity Assessment: RLE deficits/detail;LLE deficits/detail ?RLE Deficits / Details: No active movement noted. Tendency for external rotation. ROM WFL, ankle dorsiflexion to neutral ?LLE Deficits / Details: No active movement noted. ROM WFL, ankle dorsiflexion to neutral ?  ? ?Cervical / Trunk Assessment ?Cervical / Trunk Assessment: Normal  ?Communication  ? Communication: No difficulties  ?Cognition Arousal/Alertness: Awake/alert ?Behavior During Therapy: Flat affect ?Overall Cognitive Status: Within Functional Limits for tasks assessed ?  ?  ?  ?  ?  ?  ?  ?  ?  ?  ?  ?  ?  ?  ?  ?  ?  ?  ?  ? ?  ?General Comments   ? ?  ?Exercises General Exercises - Lower Extremity ?Ankle Circles/Pumps: PROM, Both, 10 reps, Supine ?Heel Slides: PROM, Both, 10 reps, Supine ?Hip ABduction/ADduction: PROM, Both, 10 reps, Supine  ? ?Assessment/Plan  ?  ?PT Assessment Patient needs continued PT services  ?PT Problem List Decreased strength;Decreased activity tolerance;Decreased balance;Decreased mobility;Impaired sensation;Pain ? ?   ?  ?PT Treatment Interventions Functional mobility training;Therapeutic activities;Therapeutic exercise;DME instruction;Balance training;Patient/family education;Wheelchair mobility training   ? ?PT Goals (Current goals can be found in the Care Plan section)  ?Acute Rehab PT Goals ?Patient Stated Goal: did not state ?PT Goal Formulation: With patient ?Time For Goal Achievement: 12/13/21 ?Potential to Achieve Goals: Good ? ?  ?Frequency Min 4X/week ?  ? ? ?Co-evaluation PT/OT/SLP Co-Evaluation/Treatment: Yes ?Reason for Co-Treatment: Complexity of the patient's impairments  (multi-system involvement);Necessary to address cognition/behavior during functional activity;For patient/therapist safety;To address functional/ADL transfers ?PT goals addressed during session: Mobility/safety with mobility;Balance ?  ?  ? ? ?  ?AM-PAC PT "6 Clicks" Mobility  ?Outcome Measure Help needed turning from your back to your side while in a flat bed without using bedrails?: A Lot ?Help needed moving from lying on your back to sitting on the side of a flat bed without using bedrails?: Total ?Help needed moving to and from a bed to a chair (including a wheelchair)?: Total ?Help needed standing up from a chair using your arms (e.g., wheelchair or bedside chair)?: Total ?Help needed to walk in hospital room?: Total ?Help needed climbing 3-5 steps with a railing? : Total ?6 Click Score: 7 ? ?  ?End of Session   ?Activity Tolerance: Patient tolerated treatment well ?Patient left: in bed;with call bell/phone within reach;with family/visitor present ?Nurse Communication: Mobility status ?PT Visit Diagnosis: Other abnormalities of gait and mobility (R26.89);Pain ?Pain - Right/Left:  (both) ?Pain - part of body: Leg ?  ? ?Time: [id number] ?PT Time Calculation (min) (ACUTE ONLY): 41 min ? ? ?Charges:   PT Evaluation ?$PT Eval Moderate Complexity: 1 Mod ?PT Treatments ?$Therapeutic Activity: 8-22 mins ?  ?   ? ? ?Wyona Almas, PT, DPT ?Acute Rehabilitation Services ?Pager [phone] ?Office [phone] ? ? ?Deno Etienne ?11/29/2021, 12:47 PM ? ?

## 2021-11-29 NOTE — Progress Notes (Signed)
PHARMACY - PHYSICIAN COMMUNICATION ?CRITICAL VALUE ALERT - BLOOD CULTURE IDENTIFICATION (BCID) ? ?Crystal Costa is an 26 y.o. female who presented to [facility] on 11/28/2021 with a chief complaint of bacteremia. ? ?Assessment:  Staph aureus (MSSA) bacteremia  ? ?Name of physician (or Provider) Contacted: Marikay Alar from neurosurgery was oncall overnight. Did not page this provider as the patient had antibiotic coverage for the organism found and it was 0300 when lab reported. Will follow up during day shift. ? ?Current antibiotics: Vancomycin and Ceftriaxone ? ?Changes to prescribed antibiotics recommended:  ?D/C vancomycin and ceftriaxone and start cefazolin ? ?Results for orders placed or performed during the hospital encounter of 11/28/21  ?Blood Culture ID Panel (Reflexed) (Collected: 11/28/2021 12:18 PM)  ?Result Value Ref Range  ? Enterococcus faecalis NOT DETECTED NOT DETECTED  ? Enterococcus Faecium NOT DETECTED NOT DETECTED  ? Listeria monocytogenes NOT DETECTED NOT DETECTED  ? Staphylococcus species DETECTED (A) NOT DETECTED  ? Staphylococcus aureus (BCID) DETECTED (A) NOT DETECTED  ? Staphylococcus epidermidis NOT DETECTED NOT DETECTED  ? Staphylococcus lugdunensis NOT DETECTED NOT DETECTED  ? Streptococcus species NOT DETECTED NOT DETECTED  ? Streptococcus agalactiae NOT DETECTED NOT DETECTED  ? Streptococcus pneumoniae NOT DETECTED NOT DETECTED  ? Streptococcus pyogenes NOT DETECTED NOT DETECTED  ? A.calcoaceticus-baumannii NOT DETECTED NOT DETECTED  ? Bacteroides fragilis NOT DETECTED NOT DETECTED  ? Enterobacterales NOT DETECTED NOT DETECTED  ? Enterobacter cloacae complex NOT DETECTED NOT DETECTED  ? Escherichia coli NOT DETECTED NOT DETECTED  ? Klebsiella aerogenes NOT DETECTED NOT DETECTED  ? Klebsiella oxytoca NOT DETECTED NOT DETECTED  ? Klebsiella pneumoniae NOT DETECTED NOT DETECTED  ? Proteus species NOT DETECTED NOT DETECTED  ? Salmonella species NOT DETECTED NOT DETECTED  ? Serratia marcescens  NOT DETECTED NOT DETECTED  ? Haemophilus influenzae NOT DETECTED NOT DETECTED  ? Neisseria meningitidis NOT DETECTED NOT DETECTED  ? Pseudomonas aeruginosa NOT DETECTED NOT DETECTED  ? Stenotrophomonas maltophilia NOT DETECTED NOT DETECTED  ? Candida albicans NOT DETECTED NOT DETECTED  ? Candida auris NOT DETECTED NOT DETECTED  ? Candida glabrata NOT DETECTED NOT DETECTED  ? Candida krusei NOT DETECTED NOT DETECTED  ? Candida parapsilosis NOT DETECTED NOT DETECTED  ? Candida tropicalis NOT DETECTED NOT DETECTED  ? Cryptococcus neoformans/gattii NOT DETECTED NOT DETECTED  ? Meth resistant mecA/C and MREJ NOT DETECTED NOT DETECTED  ? ? ?Toniann Fail Jarmel Linhardt ?11/29/2021  7:06 AM ? ?

## 2021-11-29 NOTE — Anesthesia Postprocedure Evaluation (Signed)
Anesthesia Post Note ? ?Patient: Clarice Pole ? ?Procedure(s) Performed: THORACIC LAMINECTOMY FOR EPIDURAL ABSCESS Thoraci five-Thoracic six, Thoracic six-Thoracic seven ? ?  ? ?Patient location during evaluation: PACU ?Anesthesia Type: General ?Level of consciousness: awake ?Pain management: pain level controlled ?Vital Signs Assessment: post-procedure vital signs reviewed and stable ?Respiratory status: spontaneous breathing, nonlabored ventilation, respiratory function stable and patient connected to nasal cannula oxygen ?Cardiovascular status: blood pressure returned to baseline and stable ?Postop Assessment: no apparent nausea or vomiting ?Anesthetic complications: no ? ? ?No notable events documented. ? ?Last Vitals:  ?Vitals:  ? 11/29/21 0717 11/29/21 1510  ?BP: 104/62 138/77  ?Pulse: 71 62  ?Resp: 14 17  ?Temp: 36.7 ?C 36.6 ?C  ?SpO2: 98% 100%  ?  ?Last Pain:  ?Vitals:  ? 11/29/21 1547  ?TempSrc:   ?PainSc: 3   ? ? ?  ?  ?  ?  ?  ?  ? ?Coe Angelos P Yisroel Mullendore ? ? ? ? ?

## 2021-11-29 NOTE — Consult Note (Signed)
?  [facility]  ? ? ?Date of Admission:  11/28/2021    ? ?Reason for Consult: MSSA Bacteremia ?    ?Referring Physician: CHAMP Auto Consult ? ?Current antibiotics: ?Vancomycin 4/3 -- pres ?Ceftriaxone 4/3 -- pres ? ?Previous antibiotics: ?None ? ? ?ASSESSMENT:   ? ?26 y.o. female admitted with: ? ?MSSA bacteremia: Blood cultures from 11/28/2021 are positive in all bottles for MSSA. ?T5-7 epidural abscess with resultant paraplegia: Status post thoracic laminectomy 11/28/21 with neurosurgery.  Cultures also with Staph aureus. ?Hepatitis C antibody positive ?Sepsis: Secondary to #1 and #2. ?Substance use disorder: Patient actively injects heroin prior to admission. ? ?RECOMMENDATIONS:   ? ?Change antibiotics to cefazolin ?Check TTE.  If this is negative will require TEE ?Repeat blood cultures ?Check HCV RNA ?HIV was negative ?Check other hepatitis serologies ?Will follow ? ? ?Principal Problem: ?  Epidural abscess ?Active Problems: ?  Paraplegia (HCC) ?  Sepsis due to methicillin susceptible Staphylococcus aureus (HCC) ?  Chronic viral hepatitis C (HCC) ?  Substance use disorder ? ? ?MEDICATIONS:   ? ?Scheduled Meds: ?? acetaminophen  1,000 mg Oral Q6H  ?? celecoxib  200 mg Oral Q12H  ?? Chlorhexidine Gluconate Cloth  6 each Topical Daily  ?? gabapentin  300 mg Oral TID  ?? pantoprazole  40 mg Oral Q24H  ?? senna  1 tablet Oral BID  ?? sodium chloride flush  3 mL Intravenous Q12H  ? ?Continuous Infusions: ?? sodium chloride 250 mL (11/28/21 1950)  ?? 0.9 % NaCl with KCl 20 mEq / L 75 mL/hr at 11/28/21 1958  ??  ceFAZolin (ANCEF) IV    ?? methocarbamol (ROBAXIN) IV    ? ?PRN Meds:.menthol-cetylpyridinium **OR** phenol, methocarbamol **OR** methocarbamol (ROBAXIN) IV, morphine injection, ondansetron **OR** ondansetron (ZOFRAN) IV, oxyCODONE, sodium chloride flush, sodium phosphate ? ?HPI:   ? ?Crystal Costa is a 26 y.o. female with a past medical history of injection drug use who presented to the  hospital yesterday with onset of back pain for a few days that subsequently led to tingling and numbness in her feet followed by the inability to walk.  She had an MRI in the emergency department that showed a fluid collection behind T6 and neurosurgery was consulted.  She was taken emergently to the OR for thoracic laminectomy T5-T7 for evacuation of epidural abscess.  Blood cultures obtained are positive for MSSA and she has been on broad-spectrum antibiotics.  Patient reports a history of injection drug use off-and-on for approximately 6 years.  She reports no other infectious complications related to drug use.  She states that she uses clean needles every time that she obtains from a needle exchange.  She injects heroin.  She reports that she previously was on Suboxone for 6 to 12 months however subsequently relapsed.  Her other labs were also significant for hepatitis C antibody positive.  She reports no prior history of hepatitis C infection or treatment that she is aware of.  She currently reports significant abdominal pain related to constipation and needed to have a bowel movement.  She also reports back pain. ? ? ?History reviewed. No pertinent past medical history. ? ?Social History  ? ?Tobacco Use  ?? Smoking status: Every Day  ?  Types: Cigarettes  ?Substance Use Topics  ?? Alcohol use: Yes  ?? Drug use: Yes  ?  Types: Heroin  ? ? ?History reviewed. No pertinent family history. ? ?No Known Allergies ? ?Review of Systems  ?Constitutional:  Positive for fever and malaise/fatigue.  ?Gastrointestinal:  Positive for abdominal pain and constipation.  ?Genitourinary: Negative.   ?Musculoskeletal:  Positive for back pain.  ?Neurological:  Negative for weakness.  ?All other systems reviewed and are negative. ? ?OBJECTIVE:  ? ?Blood pressure 104/62, pulse 71, temperature 98 ?F (36.7 ?C), temperature source Oral, resp. rate 14, height 5' (1.524 m), weight 72.6 kg, last menstrual period 10/30/2021, SpO2 98 %. ?Body  mass index is 31.25 kg/m?. ? ?Physical Exam ?Constitutional:   ?   Appearance: Normal appearance. She is ill-appearing.  ?HENT:  ?   Head: Normocephalic and atraumatic.  ?Eyes:  ?   Extraocular Movements: Extraocular movements intact.  ?   Conjunctiva/sclera: Conjunctivae normal.  ?Cardiovascular:  ?   Rate and Rhythm: Normal rate and regular rhythm.  ?Pulmonary:  ?   Effort: Pulmonary effort is normal. No respiratory distress.  ?   Breath sounds: Normal breath sounds.  ?Abdominal:  ?   General: There is no distension.  ?   Palpations: Abdomen is soft.  ?   Tenderness: There is no abdominal tenderness.  ?Musculoskeletal:  ?   Cervical back: Normal range of motion and neck supple.  ?   Comments: She has LE weakness.  ?Skin: ?   General: Skin is warm and dry.  ?   Findings: No rash.  ?Neurological:  ?   General: No focal deficit present.  ?   Mental Status: She is alert and oriented to person, place, and time.  ?Psychiatric:  ?   Comments: She is tearful and upset.   ? ? ? ?Lab Results: ?Lab Results  ?Component Value Date  ? WBC 23.3 (H) 11/29/2021  ? HGB 11.1 (L) 11/29/2021  ? HCT 33.4 (L) 11/29/2021  ? MCV 87.0 11/29/2021  ? PLT 443 (H) 11/29/2021  ?  ?Lab Results  ?Component Value Date  ? NA 136 11/29/2021  ? K 5.0 11/29/2021  ? CO2 23 11/29/2021  ? GLUCOSE 190 (H) 11/29/2021  ? BUN 15 11/29/2021  ? CREATININE 0.71 11/29/2021  ? CALCIUM 8.6 (L) 11/29/2021  ? GFRNONAA >60 11/29/2021  ?  ?Lab Results  ?Component Value Date  ? ALT 33 11/28/2021  ? AST 29 11/28/2021  ? ALKPHOS 102 11/28/2021  ? BILITOT 0.5 11/28/2021  ? ? ?   ?Component Value Date/Time  ? CRP 19.3 (H) 11/28/2021 1754  ? ? ?   ?Component Value Date/Time  ? ESRSEDRATE 82 (H) 11/28/2021 1754  ? ? ?I have reviewed the micro and lab results in Epic. ? ?Imaging: ?DG THORACOLUMABAR SPINE ? ?Result Date: 11/28/2021 ?CLINICAL DATA:  Localizing image 4 thoracic laminectomy for epidural abscess. EXAM: THORACOLUMBAR SPINE 1V COMPARISON:  MR thoracic spine dated  November 28, 2021 FINDINGS: Intraoperative utilization of fluoroscopy for localization. Total fluoroscopic time was 22 seconds. Total radiation dose was 13.59 mGy. IMPRESSION: Intraoperative utilization of fluoroscopy for localization. Electronically Signed   By: Larose Hires D.O.   On: 11/28/2021 16:05  ? ?MR Brain W and Wo Contrast ? ?Result Date: 11/28/2021 ?CLINICAL DATA:  Motor neuron disease suspected. Bilateral lower extremity numbness beginning 2 days ago with proximal progression to the T8 level. Bilateral leg weakness. EXAM: MRI HEAD WITHOUT AND WITH CONTRAST TECHNIQUE: Multiplanar, multiecho pulse sequences of the brain and surrounding structures were obtained without and with intravenous contrast. CONTRAST:  7.21mL GADAVIST GADOBUTROL 1 MMOL/ML IV SOLN COMPARISON:  Head CT 12/26/2017 FINDINGS: The study is moderately motion degraded. Brain: There is no evidence of  an acute infarct, intracranial hemorrhage, mass, midline shift, or extra-axial fluid collection. The ventricles and sulci are normal. No brain parenchymal signal abnormality or abnormal enhancement is identified within limitations of motion artifact. Vascular: Major intracranial vascular flow voids are preserved. Skull and upper cervical spine: No suspicious marrow lesion. Sinuses/Orbits: Unremarkable orbits. Paranasal sinuses and mastoid air cells are clear. Other: None. IMPRESSION: Negative brain MRI within limitations of motion artifact. Electronically Signed   By: Sebastian AcheAllen  Grady M.D.   On: 11/28/2021 11:58  ? ?MR Cervical Spine W or Wo Contrast ? ?Result Date: 11/28/2021 ?CLINICAL DATA:  Bilateral lower extremity numbness beginning 2 days ago with proximal progression to the T8 level. Bilateral leg weakness. EXAM: MRI CERVICAL SPINE WITHOUT AND WITH CONTRAST TECHNIQUE: Multiplanar and multiecho pulse sequences of the cervical spine, to include the craniocervical junction and cervicothoracic junction, were obtained without and with intravenous  contrast. CONTRAST:  7.195mL GADAVIST GADOBUTROL 1 MMOL/ML IV SOLN COMPARISON:  None. FINDINGS: The study is motion degraded, including moderate to severe motion artifact on multiple axial sequences. Alignment: Nelva BushNorma

## 2021-11-29 NOTE — Evaluation (Signed)
Occupational Therapy Evaluation ?Patient Details ?Name: Crystal Costa ?MRN: [id number] ?DOB: 13-Dec-1995 ?Today's Date: 11/29/2021 ? ? ?History of Present Illness Pt is a 26 y.o. F who presents with acute paraplegia of unknown duration. Imaging showed epidural abscess T5-7. S/p thoracic laminectomy T5-T7 on 4/3. She states she does use heroin. No other significant PMH on file.  ? ?Clinical Impression ?  ?Pt reports independence at baseline with ADLs and functional mobility, lives with her boyfriend's mother. Pt currently reporting 8/10 burning pain in BLE's with movement. Pt mod - max A for most ADLs at bed level, as pt mod -max A to sit EOB. Transfers deferred at this time due to pain. Pt max A +2 for bed mobility to roll R/L for pericare and pad change during session. Pt presenting with impairments listed below, will follow acutely. Recommend AIR at d/c. ?   ? ?Recommendations for follow up therapy are one component of a multi-disciplinary discharge planning process, led by the attending physician.  Recommendations may be updated based on patient status, additional functional criteria and insurance authorization.  ? ?Follow Up Recommendations ? Acute inpatient rehab (3hours/day)  ?  ?Assistance Recommended at Discharge Intermittent Supervision/Assistance  ?Patient can return home with the following Two people to help with walking and/or transfers;Two people to help with bathing/dressing/bathroom;Assistance with cooking/housework;Assist for transportation;Help with stairs or ramp for entrance ? ?  ?Functional Status Assessment ? Patient has had a recent decline in their functional status and demonstrates the ability to make significant improvements in function in a reasonable and predictable amount of time.  ?Equipment Recommendations ? Wheelchair (measurements OT);Wheelchair cushion (measurements OT);Tub/shower bench;Hospital bed;BSC/3in1;Other (comment) Secondary school teacher)  ?  ?Recommendations for Other Services Rehab  consult ? ? ?  ?Precautions / Restrictions Precautions ?Precautions: Fall;Other (comment) ?Precaution Comments: hemovac, paraplegia ?Restrictions ?Weight Bearing Restrictions: No  ? ?  ? ?Mobility Bed Mobility ?Overal bed mobility: Needs Assistance ?Bed Mobility: Rolling, Supine to Sit, Sit to Supine ?Rolling: Max assist ?  ?Supine to sit: Max assist, +2 for physical assistance ?Sit to supine: Max assist, +2 for physical assistance ?  ?  ?  ? ?Transfers ?  ?  ?  ?  ?  ?  ?  ?  ?  ?General transfer comment: deferred due to pain ?  ? ?  ?Balance Overall balance assessment: Needs assistance ?Sitting-balance support: Feet supported ?Sitting balance-Leahy Scale: Poor ?Sitting balance - Comments: Requiring mod-maxA for sitting balance, BUE support, static sitting ~5 minutes ?  ?  ?  ?  ?  ?  ?  ?  ?  ?  ?  ?  ?  ?  ?  ?   ? ?ADL either performed or assessed with clinical judgement  ? ?ADL Overall ADL's : Needs assistance/impaired ?Eating/Feeding: Set up;Bed level ?  ?Grooming: Bed level;Minimal assistance ?  ?Upper Body Bathing: Moderate assistance;Bed level ?  ?Lower Body Bathing: Maximal assistance;Bed level ?  ?Upper Body Dressing : Bed level;Minimal assistance ?  ?Lower Body Dressing: Maximal assistance;Bed level ?  ?Toilet Transfer: Maximal assistance ?Toilet Transfer Details (indicate cue type and reason): bed level ?Toileting- Clothing Manipulation and Hygiene: Maximal assistance;Bed level ?  ?  ?  ?Functional mobility during ADLs: Maximal assistance;+2 for physical assistance ?   ? ? ? ?Vision   ?Vision Assessment?: No apparent visual deficits  ?   ?Perception   ?  ?Praxis   ?  ? ?Pertinent Vitals/Pain Pain Assessment ?Pain Assessment: Faces ?Pain Score: 8  ?Faces  Pain Scale: Hurts whole lot ?Pain Location: BLE's ?Pain Descriptors / Indicators: Burning, Discomfort, Constant ?Pain Intervention(s): Limited activity within patient's tolerance, Monitored during session, Repositioned  ? ? ? ?Hand Dominance Right ?   ?Extremity/Trunk Assessment Upper Extremity Assessment ?Upper Extremity Assessment: Overall WFL for tasks assessed ?  ?Lower Extremity Assessment ?Lower Extremity Assessment: Defer to PT evaluation ?RLE Deficits / Details: No active movement noted. Tendency for external rotation. ROM WFL, ankle dorsiflexion to neutral ?LLE Deficits / Details: No active movement noted. ROM WFL, ankle dorsiflexion to neutral ?  ?Cervical / Trunk Assessment ?Cervical / Trunk Assessment: Normal ?  ?Communication Communication ?Communication: No difficulties ?  ?Cognition Arousal/Alertness: Awake/alert ?Behavior During Therapy: Flat affect, Anxious ?Overall Cognitive Status: Within Functional Limits for tasks assessed ?  ?  ?  ?  ?  ?  ?  ?  ?  ?  ?  ?  ?  ?  ?  ?  ?  ?  ?  ?General Comments  BP WNL in supine and sitting EOB ? ?  ?Exercises   ?  ?Shoulder Instructions    ? ? ?Home Living Family/patient expects to be discharged to:: Private residence ?Living Arrangements: Other relatives (boyfreind's mother) ?Available Help at Discharge: Friend(s) ?Type of Home: House ?Home Access: Stairs to enter ?Entrance Stairs-Number of Steps: 3 ?Entrance Stairs-Rails: Right;Left ?Home Layout: One level ?  ?  ?Bathroom Shower/Tub: Tub/shower unit ?  ?  ?  ?  ?  ?  ?  ?  ? ?  ?Prior Functioning/Environment Prior Level of Function : Independent/Modified Independent ?  ?  ?  ?  ?  ?  ?Mobility Comments: does not work ?  ?  ? ?  ?  ?OT Problem List: Decreased strength;Decreased activity tolerance;Decreased range of motion;Impaired balance (sitting and/or standing);Decreased knowledge of precautions;Decreased knowledge of use of DME or AE;Impaired sensation;Pain ?  ?   ?OT Treatment/Interventions: Self-care/ADL training;DME and/or AE instruction;Energy conservation;Therapeutic activities;Patient/family education  ?  ?OT Goals(Current goals can be found in the care plan section) Acute Rehab OT Goals ?Patient Stated Goal: to rest ?OT Goal Formulation: With  patient ?Time For Goal Achievement: 12/13/21 ?Potential to Achieve Goals: Fair ?ADL Goals ?Pt Will Perform Grooming: with modified independence;bed level;sitting ?Pt Will Perform Upper Body Bathing: with modified independence;bed level;sitting ?Pt Will Transfer to Toilet: with transfer board;with min assist;bedside commode;with mod assist  ?OT Frequency: Min 3X/week ?  ? ?Co-evaluation PT/OT/SLP Co-Evaluation/Treatment: Yes ?Reason for Co-Treatment: Complexity of the patient's impairments (multi-system involvement);For patient/therapist safety;To address functional/ADL transfers ?PT goals addressed during session: Mobility/safety with mobility;Balance ?OT goals addressed during session: ADL's and self-care;Strengthening/ROM ?  ? ?  ?AM-PAC OT "6 Clicks" Daily Activity     ?Outcome Measure Help from another person eating meals?: None ?Help from another person taking care of personal grooming?: A Little ?Help from another person toileting, which includes using toliet, bedpan, or urinal?: Total ?Help from another person bathing (including washing, rinsing, drying)?: A Lot ?Help from another person to put on and taking off regular upper body clothing?: A Lot ?Help from another person to put on and taking off regular lower body clothing?: Total ?6 Click Score: 13 ?  ?End of Session Nurse Communication: Mobility status ? ?Activity Tolerance: Patient tolerated treatment well ?Patient left: in bed;with call bell/phone within reach;with bed alarm set;with family/visitor present ? ?OT Visit Diagnosis: Muscle weakness (generalized) (M62.81);Other symptoms and signs involving the nervous system (R29.898)  ?              ?  Time: [id number] ?OT Time Calculation (min): 41 min ?Charges:  OT General Charges ?$OT Visit: 1 Visit ?OT Evaluation ?$OT Eval Moderate Complexity: 1 Mod ? ?Alfonzo BeersNatalie Jakyrie Totherow, OTD, OTR/L ?Acute Rehab ?[phone] ? ?Mayer MaskerNatalie M Aleatha Taite ?11/29/2021, 1:12 PM ?

## 2021-11-29 NOTE — Progress Notes (Signed)
Subjective: ?Patient reports leg and back pain, seems burning in nature ? ?Objective: ?Vital signs in last 24 hours: ?Temp:  [97.2 ?F (36.2 ?C)-99.4 ?F (37.4 ?C)] 98 ?F (36.7 ?C) (04/04 9417) ?Pulse Rate:  [71-105] 71 (04/04 0717) ?Resp:  [14-20] 14 (04/04 0717) ?BP: (90-129)/(51-90) 104/62 (04/04 0717) ?SpO2:  [95 %-100 %] 98 % (04/04 0717) ?Weight:  [72.6 kg] 72.6 kg (04/03 1329) ? ?Intake/Output from previous day: ?04/03 0701 - 04/04 0700 ?In: 2383 [I.V.:1783; IV Piggyback:600] ?Out: 3480 [Urine:3400; Drains:30; Blood:50] ?Intake/Output this shift: ?No intake/output data recorded. ? ?She arouses easily, still no motor mvmt in legs but she now had some gross sensation to LT, and I think some neuropathic pain ? ?Lab Results: ?Lab Results  ?Component Value Date  ? WBC 23.3 (H) 11/29/2021  ? HGB 11.1 (L) 11/29/2021  ? HCT 33.4 (L) 11/29/2021  ? MCV 87.0 11/29/2021  ? PLT 443 (H) 11/29/2021  ? ?Lab Results  ?Component Value Date  ? INR 1.2 11/28/2021  ? ?BMET ?Lab Results  ?Component Value Date  ? NA 136 11/29/2021  ? K 5.0 11/29/2021  ? CL 103 11/29/2021  ? CO2 23 11/29/2021  ? GLUCOSE 190 (H) 11/29/2021  ? BUN 15 11/29/2021  ? CREATININE 0.71 11/29/2021  ? CALCIUM 8.6 (L) 11/29/2021  ? ? ?Studies/Results: ?DG THORACOLUMABAR SPINE ? ?Result Date: 11/28/2021 ?CLINICAL DATA:  Localizing image 4 thoracic laminectomy for epidural abscess. EXAM: THORACOLUMBAR SPINE 1V COMPARISON:  MR thoracic spine dated November 28, 2021 FINDINGS: Intraoperative utilization of fluoroscopy for localization. Total fluoroscopic time was 22 seconds. Total radiation dose was 13.59 mGy. IMPRESSION: Intraoperative utilization of fluoroscopy for localization. Electronically Signed   By: Larose Hires D.O.   On: 11/28/2021 16:05  ? ?MR Brain W and Wo Contrast ? ?Result Date: 11/28/2021 ?CLINICAL DATA:  Motor neuron disease suspected. Bilateral lower extremity numbness beginning 2 days ago with proximal progression to the T8 level. Bilateral leg weakness.  EXAM: MRI HEAD WITHOUT AND WITH CONTRAST TECHNIQUE: Multiplanar, multiecho pulse sequences of the brain and surrounding structures were obtained without and with intravenous contrast. CONTRAST:  7.83mL GADAVIST GADOBUTROL 1 MMOL/ML IV SOLN COMPARISON:  Head CT 12/26/2017 FINDINGS: The study is moderately motion degraded. Brain: There is no evidence of an acute infarct, intracranial hemorrhage, mass, midline shift, or extra-axial fluid collection. The ventricles and sulci are normal. No brain parenchymal signal abnormality or abnormal enhancement is identified within limitations of motion artifact. Vascular: Major intracranial vascular flow voids are preserved. Skull and upper cervical spine: No suspicious marrow lesion. Sinuses/Orbits: Unremarkable orbits. Paranasal sinuses and mastoid air cells are clear. Other: None. IMPRESSION: Negative brain MRI within limitations of motion artifact. Electronically Signed   By: Sebastian Ache M.D.   On: 11/28/2021 11:58  ? ?MR Cervical Spine W or Wo Contrast ? ?Result Date: 11/28/2021 ?CLINICAL DATA:  Bilateral lower extremity numbness beginning 2 days ago with proximal progression to the T8 level. Bilateral leg weakness. EXAM: MRI CERVICAL SPINE WITHOUT AND WITH CONTRAST TECHNIQUE: Multiplanar and multiecho pulse sequences of the cervical spine, to include the craniocervical junction and cervicothoracic junction, were obtained without and with intravenous contrast. CONTRAST:  7.70mL GADAVIST GADOBUTROL 1 MMOL/ML IV SOLN COMPARISON:  None. FINDINGS: The study is motion degraded, including moderate to severe motion artifact on multiple axial sequences. Alignment: Normal. Vertebrae: Diffusely diminished bone marrow T1 signal intensity, nonspecific but can be seen with anemia, smoking, and obesity. No suspicious focal marrow lesion, fracture, significant marrow edema, or evidence  of discitis. Cord: Normal signal and morphology. Posterior Fossa, vertebral arteries, paraspinal tissues:  Unremarkable. Disc levels: Preserved disc height and hydration throughout the cervical spine. No sizable disc herniation or stenosis. IMPRESSION: Motion degraded examination. No disc herniation or stenosis. Normal appearance of the cervical spinal cord. Electronically Signed   By: Sebastian Ache M.D.   On: 11/28/2021 12:04  ? ?MR THORACIC SPINE W WO CONTRAST ? ?Result Date: 11/28/2021 ?CLINICAL DATA:  Mid-back pain, neuro deficit. Bilateral lower extremity numbness beginning 2 days ago with proximal progression to the T8 level. Bilateral leg weakness. EXAM: MRI THORACIC WITHOUT AND WITH CONTRAST TECHNIQUE: Multiplanar and multiecho pulse sequences of the thoracic spine were obtained without and with intravenous contrast. CONTRAST:  7.70mL GADAVIST GADOBUTROL 1 MMOL/ML IV SOLN COMPARISON:  None. FINDINGS: Alignment:  Mild lower thoracic levoscoliosis.  No listhesis. Vertebrae: Diffusely diminished bone marrow T1 signal intensity, nonspecific though can be seen with anemia, smoking, and obesity. No suspicious focal marrow lesion, fracture, or evidence of discitis. Multiple small Schmorl's nodes in the lower thoracic spine. Fluid in the left T6-7 facet joint with associated mild marrow edema and enhancement and more prominent edema and enhancement in the adjacent paraspinal soft tissues. Abnormal T2 hyperintense dorsal epidural material in the spinal canal extending from T3-T8, greatest from T5-T7 where it measures up to 8 mm in AP thickness. This epidural material largely enhances, however there are a few subcentimeter nonenhancing foci at T6. Cord: The above described epidural process results in severe cord compression at T6, and cord edema is suspected at T6-7. Paraspinal and other soft tissues: Left-sided paraspinal soft tissue inflammation as noted above, extending superiorly greater than inferiorly from the T6-7 facet joint. 1 cm nonenhancing fluid collection posterosuperior to the left T6-7 facet joint. Partially  visualized mild bilateral hydronephrosis, right greater than left. Trace left pleural effusion. Disc levels: Severe spinal stenosis from T5-6 to T6-7 by the epidural process. IMPRESSION: 1. Dorsal epidural collection in the midthoracic spine most consistent with infectious phlegmon/abscess. This results in severe spinal stenosis with cord compression and likely cord edema. 2. Inflammation about the left T6-7 facet joint likely reflecting septic facet arthritis with adjacent 1 cm abscess in the paraspinal soft tissues. Critical Value/emergent results were called by telephone at the time of interpretation on 11/28/2021 at 12:13 pm to Dr. Rubin Payor, who verbally acknowledged these results. Electronically Signed   By: Sebastian Ache M.D.   On: 11/28/2021 12:18  ? ?DG C-Arm 1-60 Min-No Report ? ?Result Date: 11/28/2021 ?Fluoroscopy was utilized by the requesting physician.  No radiographic interpretation.   ? ?Assessment/Plan: ?Thoracic epidural abscess, positive blood cultures with staph. ID will be called, on Vanco and rocephin. Also hep C positive. HIV neg.  ? ?PT/OT. Will need rehab for paraplegia. Some improvement in sensation may be a positive sign.  ? ?Estimated body mass index is 31.25 kg/m? as calculated from the following: ?  Height as of this encounter: 5' (1.524 m). ?  Weight as of this encounter: 72.6 kg. ? ? ? LOS: 1 day  ? ? ?Tia Alert ?11/29/2021, 7:51 AM ? ? ? ? ?

## 2021-11-30 DIAGNOSIS — G839 Paralytic syndrome, unspecified: Secondary | ICD-10-CM

## 2021-11-30 LAB — HEPATITIS B SURFACE ANTIGEN: Hepatitis B Surface Ag: NONREACTIVE

## 2021-11-30 LAB — HEPATITIS A ANTIBODY, TOTAL: hep A Total Ab: REACTIVE — AB

## 2021-11-30 LAB — HEPATITIS B CORE ANTIBODY, TOTAL: Hep B Core Total Ab: NONREACTIVE

## 2021-11-30 LAB — HEPATITIS B SURFACE ANTIBODY,QUALITATIVE: Hep B S Ab: NONREACTIVE

## 2021-11-30 MED ORDER — MUPIROCIN 2 % EX OINT
TOPICAL_OINTMENT | Freq: Two times a day (BID) | CUTANEOUS | Status: DC
  Filled 2021-11-30 (×6): qty 22

## 2021-11-30 NOTE — Progress Notes (Addendum)
?   ? ?[facility] ? ?Date of Admission:  11/28/2021    ?       ?Reason for Consult: MSSA Bacteremia ?                                  ?Current antibiotics: ?Cefazolin 4/4 -- pres ?  ?Previous antibiotics: ?Vancomycin 4/3 -- pres ?Ceftriaxone 4/3 -- pres ? ? ? ?ASSESSMENT:   ? ?26 y.o. female admitted with: ? ?MSSA bacteremia: Blood cultures from 4/3 positive and repeat cultures obtained this morning. ?T5-T7 epidural abscess with resultant paraplegia: Status post thoracic laminectomy 4/3 with neurosurgery.  Cultures also growing Staph aureus. ?Hepatitis C antibody positive: HCV RNA pending. ?Sepsis: Secondary to #1 and #2. ?Substance use disorder: Patient actively injecting heroin prior to admission. ? ?RECOMMENDATIONS:   ? ?Continue cefazolin ?Follow up repeat blood cultures ?If unable to regain IV access prior to her next dose of cefazolin, will do linezolid short-term until reliable access is reestablished ?TEE.  I have contacted cardiology and this will hopefully be arranged later this week. ?Lab monitoring ?Follow-up hepatitis serologies.  HIV was negative. ?Check RPR ?Will follow ? ? ?Principal Problem: ?  MSSA bacteremia ?Active Problems: ?  Epidural abscess ?  Paraplegia (HCC) ?  Sepsis due to methicillin susceptible Staphylococcus aureus (HCC) ?  Chronic viral hepatitis C (HCC) ?  Substance use disorder ? ? ? ?MEDICATIONS:   ? ?Scheduled Meds: ? celecoxib  200 mg Oral Q12H  ? Chlorhexidine Gluconate Cloth  6 each Topical Daily  ? gabapentin  300 mg Oral TID  ? pantoprazole  40 mg Oral Q24H  ? senna  1 tablet Oral BID  ? sodium chloride flush  3 mL Intravenous Q12H  ? ?Continuous Infusions: ? sodium chloride 250 mL (11/28/21 1950)  ? 0.9 % NaCl with KCl 20 mEq / L 75 mL/hr at 11/28/21 1958  ?  ceFAZolin (ANCEF) IV 2 g (11/30/21 6160)  ? methocarbamol (ROBAXIN) IV    ? ?PRN Meds:.menthol-cetylpyridinium **OR** phenol, methocarbamol **OR** methocarbamol (ROBAXIN) IV, morphine  injection, ondansetron **OR** ondansetron (ZOFRAN) IV, oxyCODONE, sodium chloride flush, sodium phosphate ? ?SUBJECTIVE:  ? ?24 hour events:  ?There are no significant events noted overnight ?Vitals have been relatively stable with Tmax 97.8 ?Repeat blood cultures obtained this morning in addition to hepatitis serologies ?No new imaging ?TTE did not see any evidence of endocarditis ?Surgical cultures isolated Staph aureus at this time ? ? ?Patient reports back pain.  No fevers.  She is frustrated about loss of IV access and is asking for her nurse.  She reports having some sensation in her legs but no return of movement. ? ?Review of Systems  ?All other systems reviewed and are negative. ? ?  ?OBJECTIVE:  ? ?Blood pressure (!) 99/59, pulse 76, temperature 97.7 ?F (36.5 ?C), temperature source Oral, resp. rate 18, height 5' (1.524 m), weight 72.6 kg, last menstrual period 10/30/2021, SpO2 100 %. ?Body mass index is 31.25 kg/m?. ? ?Physical Exam ?Constitutional:   ?   General: She is not in acute distress. ?HENT:  ?   Head: Normocephalic and atraumatic.  ?Cardiovascular:  ?   Rate and Rhythm: Normal rate and regular rhythm.  ?Pulmonary:  ?   Effort: Pulmonary effort is normal. No respiratory distress.  ?   Breath sounds: Normal breath sounds.  ?Abdominal:  ?   General: There is no distension.  ?  Palpations: Abdomen is soft.  ?Musculoskeletal:  ?   Comments: Hemovac in place and Foley catheter in place. ?She reports sensation to light touch.  Remains paraplegic in her lower extremities bilaterally  ?Skin: ?   General: Skin is warm and dry.  ?   Findings: No rash.  ?Neurological:  ?   General: No focal deficit present.  ?   Mental Status: She is alert and oriented to person, place, and time.  ?   Sensory: Sensory deficit present.  ?   Motor: Weakness present.  ?Psychiatric:     ?   Mood and Affect: Mood normal.     ?   Behavior: Behavior normal.  ? ? ? ?Lab Results: ?Lab Results  ?Component Value Date  ? WBC 23.3 (H)  11/29/2021  ? HGB 11.1 (L) 11/29/2021  ? HCT 33.4 (L) 11/29/2021  ? MCV 87.0 11/29/2021  ? PLT 443 (H) 11/29/2021  ?  ?Lab Results  ?Component Value Date  ? NA 136 11/29/2021  ? K 5.0 11/29/2021  ? CO2 23 11/29/2021  ? GLUCOSE 190 (H) 11/29/2021  ? BUN 15 11/29/2021  ? CREATININE 0.71 11/29/2021  ? CALCIUM 8.6 (L) 11/29/2021  ? GFRNONAA >60 11/29/2021  ?  ?Lab Results  ?Component Value Date  ? ALT 33 11/28/2021  ? AST 29 11/28/2021  ? ALKPHOS 102 11/28/2021  ? BILITOT 0.5 11/28/2021  ? ? ?   ?Component Value Date/Time  ? CRP 19.3 (H) 11/28/2021 1754  ? ? ?   ?Component Value Date/Time  ? ESRSEDRATE 82 (H) 11/28/2021 1754  ? ?  ?I have reviewed the micro and lab results in Epic. ? ?Imaging: ?DG THORACOLUMABAR SPINE ? ?Result Date: 11/28/2021 ?CLINICAL DATA:  Localizing image 4 thoracic laminectomy for epidural abscess. EXAM: THORACOLUMBAR SPINE 1V COMPARISON:  MR thoracic spine dated November 28, 2021 FINDINGS: Intraoperative utilization of fluoroscopy for localization. Total fluoroscopic time was 22 seconds. Total radiation dose was 13.59 mGy. IMPRESSION: Intraoperative utilization of fluoroscopy for localization. Electronically Signed   By: Larose HiresImran  Ahmed D.O.   On: 11/28/2021 16:05  ? ?MR Brain W and Wo Contrast ? ?Result Date: 11/28/2021 ?CLINICAL DATA:  Motor neuron disease suspected. Bilateral lower extremity numbness beginning 2 days ago with proximal progression to the T8 level. Bilateral leg weakness. EXAM: MRI HEAD WITHOUT AND WITH CONTRAST TECHNIQUE: Multiplanar, multiecho pulse sequences of the brain and surrounding structures were obtained without and with intravenous contrast. CONTRAST:  7.725mL GADAVIST GADOBUTROL 1 MMOL/ML IV SOLN COMPARISON:  Head CT 12/26/2017 FINDINGS: The study is moderately motion degraded. Brain: There is no evidence of an acute infarct, intracranial hemorrhage, mass, midline shift, or extra-axial fluid collection. The ventricles and sulci are normal. No brain parenchymal signal abnormality  or abnormal enhancement is identified within limitations of motion artifact. Vascular: Major intracranial vascular flow voids are preserved. Skull and upper cervical spine: No suspicious marrow lesion. Sinuses/Orbits: Unremarkable orbits. Paranasal sinuses and mastoid air cells are clear. Other: None. IMPRESSION: Negative brain MRI within limitations of motion artifact. Electronically Signed   By: Sebastian AcheAllen  Grady M.D.   On: 11/28/2021 11:58  ? ?MR Cervical Spine W or Wo Contrast ? ?Result Date: 11/28/2021 ?CLINICAL DATA:  Bilateral lower extremity numbness beginning 2 days ago with proximal progression to the T8 level. Bilateral leg weakness. EXAM: MRI CERVICAL SPINE WITHOUT AND WITH CONTRAST TECHNIQUE: Multiplanar and multiecho pulse sequences of the cervical spine, to include the craniocervical junction and cervicothoracic junction, were obtained without and with intravenous  contrast. CONTRAST:  7.55mL GADAVIST GADOBUTROL 1 MMOL/ML IV SOLN COMPARISON:  None. FINDINGS: The study is motion degraded, including moderate to severe motion artifact on multiple axial sequences. Alignment: Normal. Vertebrae: Diffusely diminished bone marrow T1 signal intensity, nonspecific but can be seen with anemia, smoking, and obesity. No suspicious focal marrow lesion, fracture, significant marrow edema, or evidence of discitis. Cord: Normal signal and morphology. Posterior Fossa, vertebral arteries, paraspinal tissues: Unremarkable. Disc levels: Preserved disc height and hydration throughout the cervical spine. No sizable disc herniation or stenosis. IMPRESSION: Motion degraded examination. No disc herniation or stenosis. Normal appearance of the cervical spinal cord. Electronically Signed   By: Sebastian Ache M.D.   On: 11/28/2021 12:04  ? ?MR THORACIC SPINE W WO CONTRAST ? ?Result Date: 11/28/2021 ?CLINICAL DATA:  Mid-back pain, neuro deficit. Bilateral lower extremity numbness beginning 2 days ago with proximal progression to the T8 level.  Bilateral leg weakness. EXAM: MRI THORACIC WITHOUT AND WITH CONTRAST TECHNIQUE: Multiplanar and multiecho pulse sequences of the thoracic spine were obtained without and with intravenous contrast. CONTRAST:

## 2021-11-30 NOTE — Progress Notes (Signed)
Occupational Therapy Treatment ?Patient Details ?Name: Crystal Costa ?MRN: [id number] ?DOB: 1996/08/25 ?Today's Date: 11/30/2021 ? ? ?History of present illness Pt is a 26 y.o. F who presents with acute paraplegia of unknown duration. Imaging showed epidural abscess T5-7. She states she does use heroin. No other significant PMH on file. ?  ?OT comments ? Pt progressing towards goals, focus of session was sitting balance at EOB. Pt max A +2 for bed mobility using helicopter method to sit EOB. Pt able to support self for brief ~5 second periods with BUE's. Facilitated R/L weight shifting onto elbows with mod-max A. Pt able to complete lateral scoot transfer to chair via transfer board with max A +2, pt benefitting from increased cuing for hand placement. Pt presenting with impairments listed below, will follow acutely. Continue to recommend AIR at d/c.  ? ?Recommendations for follow up therapy are one component of a multi-disciplinary discharge planning process, led by the attending physician.  Recommendations may be updated based on patient status, additional functional criteria and insurance authorization. ?   ?Follow Up Recommendations ? Acute inpatient rehab (3hours/day)  ?  ?Assistance Recommended at Discharge Intermittent Supervision/Assistance  ?Patient can return home with the following ? Two people to help with walking and/or transfers;Two people to help with bathing/dressing/bathroom;Assistance with cooking/housework;Assist for transportation;Help with stairs or ramp for entrance ?  ?Equipment Recommendations ? Wheelchair (measurements OT);Wheelchair cushion (measurements OT);Tub/shower bench;Hospital bed;BSC/3in1;Other (comment)  ?  ?Recommendations for Other Services Rehab consult ? ?  ?Precautions / Restrictions Precautions ?Precautions: Fall;Other (comment) ?Precaution Comments: hemovac, paraplegia ?Restrictions ?Weight Bearing Restrictions: No  ? ? ?  ? ?Mobility Bed Mobility ?Overal bed mobility: Needs  Assistance ?Bed Mobility: Supine to Sit ?  ?  ?Supine to sit: Max assist, +2 for physical assistance ?  ?  ?General bed mobility comments: max A +2 for helicopter method to EOB ?  ? ?Transfers ?Overall transfer level: Needs assistance ?Equipment used: Sliding board ?Transfers: Bed to chair/wheelchair/BSC ?  ?  ?  ?  ?  ? Lateral/Scoot Transfers: +2 physical assistance, Max assist ?  ?  ?  ?Balance Overall balance assessment: Needs assistance ?Sitting-balance support: Feet supported ?Sitting balance-Leahy Scale: Poor ?Sitting balance - Comments: pt able to support self in sitting with close min guard A +2, with hands posteriorly on bed ?  ?  ?  ?  ?  ?  ?  ?  ?  ?  ?  ?  ?  ?  ?  ?   ? ?ADL either performed or assessed with clinical judgement  ? ?ADL Overall ADL's : Needs assistance/impaired ?  ?  ?  ?  ?  ?  ?  ?  ?  ?  ?Lower Body Dressing: Maximal assistance;Bed level ?Lower Body Dressing Details (indicate cue type and reason): to don socks ?  ?  ?  ?  ?  ?  ?  ?  ?  ? ?Extremity/Trunk Assessment Upper Extremity Assessment ?Upper Extremity Assessment: Generalized weakness ?  ?Lower Extremity Assessment ?Lower Extremity Assessment: Defer to PT evaluation ?  ?  ?  ? ?Vision   ?Vision Assessment?: No apparent visual deficits ?  ?Perception Perception ?Perception: Not tested ?  ?Praxis Praxis ?Praxis: Not tested ?  ? ?Cognition Arousal/Alertness: Awake/alert ?Behavior During Therapy: Flat affect, Anxious ?Overall Cognitive Status: Within Functional Limits for tasks assessed ?  ?  ?  ?  ?  ?  ?  ?  ?  ?  ?  ?  ?  ?  ?  ?  ?  ?  ?  ?   ?  Exercises   ? ?  ?Shoulder Instructions   ? ? ?  ?General Comments BP WNL throughout session  ? ? ?Pertinent Vitals/ Pain       Pain Assessment ?Pain Assessment: Faces ?Pain Score: 8  ?Faces Pain Scale: Hurts whole lot ?Pain Location: BLE's ?Pain Descriptors / Indicators: Burning, Discomfort, Constant ?Pain Intervention(s): Limited activity within patient's tolerance, Monitored during  session, Premedicated before session, Repositioned ? ?Home Living   ?  ?  ?  ?  ?  ?  ?  ?  ?  ?  ?  ?  ?  ?  ?  ?  ?  ?  ? ?  ?Prior Functioning/Environment    ?  ?  ?  ?   ? ?Frequency ? Min 3X/week  ? ? ? ? ?  ?Progress Toward Goals ? ?OT Goals(current goals can now be found in the care plan section) ? Progress towards OT goals: Progressing toward goals ? ?Acute Rehab OT Goals ?Patient Stated Goal: to get to chair ?OT Goal Formulation: With patient ?Time For Goal Achievement: 12/13/21 ?Potential to Achieve Goals: Fair ?ADL Goals ?Pt Will Perform Grooming: with modified independence;bed level;sitting ?Pt Will Perform Upper Body Bathing: with modified independence;bed level;sitting ?Pt Will Transfer to Toilet: with transfer board;with min assist;bedside commode;with mod assist  ?Plan Discharge plan remains appropriate;Frequency remains appropriate   ? ?Co-evaluation ? ? ? PT/OT/SLP Co-Evaluation/Treatment: Yes ?Reason for Co-Treatment: Complexity of the patient's impairments (multi-system involvement);For patient/therapist safety;To address functional/ADL transfers ?  ?  ?  ? ?  ?AM-PAC OT "6 Clicks" Daily Activity     ?Outcome Measure ? ? Help from another person eating meals?: None ?Help from another person taking care of personal grooming?: A Little ?Help from another person toileting, which includes using toliet, bedpan, or urinal?: Total ?Help from another person bathing (including washing, rinsing, drying)?: A Lot ?Help from another person to put on and taking off regular upper body clothing?: A Lot ?Help from another person to put on and taking off regular lower body clothing?: Total ?6 Click Score: 13 ? ?  ?End of Session Equipment Utilized During Treatment: Gait belt ? ?OT Visit Diagnosis: Muscle weakness (generalized) (M62.81);Other symptoms and signs involving the nervous system (R29.898) ?  ?Activity Tolerance Patient limited by fatigue ?  ?Patient Left in chair;with call bell/phone within reach;with  chair alarm set;with family/visitor present ?  ?Nurse Communication Mobility status ?  ? ?   ? ?Time: 1610-9604 ?OT Time Calculation (min): 25 min ? ?Charges: OT General Charges ?$OT Visit: 1 Visit ?OT Treatments ?$Therapeutic Activity: 8-22 mins ? ?Alfonzo Beers, OTD, OTR/L ?Acute Rehab ?[phone] ? ? ?Mayer Masker ?11/30/2021, 3:28 PM ?

## 2021-11-30 NOTE — Progress Notes (Addendum)
Subjective: ?Patient reports moderate back pain and still cant move her legs  ? ?Objective: ?Vital signs in last 24 hours: ?Temp:  [97.7 ?F (36.5 ?C)-97.8 ?F (36.6 ?C)] 97.7 ?F (36.5 ?C) (04/04 2127) ?Pulse Rate:  [62-76] 76 (04/04 2127) ?Resp:  [17-18] 18 (04/04 2127) ?BP: (99-138)/(59-77) 99/59 (04/04 2127) ?SpO2:  [100 %] 100 % (04/04 2127) ? ?Intake/Output from previous day: ?04/04 0701 - 04/05 0700 ?In: 297.5 [I.V.:197.5; IV Piggyback:100] ?Out: 2460 [Urine:2450; Drains:10] ?Intake/Output this shift: ?No intake/output data recorded. ? ? ?Lab Results: ?Lab Results  ?Component Value Date  ? WBC 23.3 (H) 11/29/2021  ? HGB 11.1 (L) 11/29/2021  ? HCT 33.4 (L) 11/29/2021  ? MCV 87.0 11/29/2021  ? PLT 443 (H) 11/29/2021  ? ?Lab Results  ?Component Value Date  ? INR 1.2 11/28/2021  ? ?BMET ?Lab Results  ?Component Value Date  ? NA 136 11/29/2021  ? K 5.0 11/29/2021  ? CL 103 11/29/2021  ? CO2 23 11/29/2021  ? GLUCOSE 190 (H) 11/29/2021  ? BUN 15 11/29/2021  ? CREATININE 0.71 11/29/2021  ? CALCIUM 8.6 (L) 11/29/2021  ? ? ?Studies/Results: ?DG THORACOLUMABAR SPINE ? ?Result Date: 11/28/2021 ?CLINICAL DATA:  Localizing image 4 thoracic laminectomy for epidural abscess. EXAM: THORACOLUMBAR SPINE 1V COMPARISON:  MR thoracic spine dated November 28, 2021 FINDINGS: Intraoperative utilization of fluoroscopy for localization. Total fluoroscopic time was 22 seconds. Total radiation dose was 13.59 mGy. IMPRESSION: Intraoperative utilization of fluoroscopy for localization. Electronically Signed   By: Larose Hires D.O.   On: 11/28/2021 16:05  ? ?MR Brain W and Wo Contrast ? ?Result Date: 11/28/2021 ?CLINICAL DATA:  Motor neuron disease suspected. Bilateral lower extremity numbness beginning 2 days ago with proximal progression to the T8 level. Bilateral leg weakness. EXAM: MRI HEAD WITHOUT AND WITH CONTRAST TECHNIQUE: Multiplanar, multiecho pulse sequences of the brain and surrounding structures were obtained without and with intravenous  contrast. CONTRAST:  7.18mL GADAVIST GADOBUTROL 1 MMOL/ML IV SOLN COMPARISON:  Head CT 12/26/2017 FINDINGS: The study is moderately motion degraded. Brain: There is no evidence of an acute infarct, intracranial hemorrhage, mass, midline shift, or extra-axial fluid collection. The ventricles and sulci are normal. No brain parenchymal signal abnormality or abnormal enhancement is identified within limitations of motion artifact. Vascular: Major intracranial vascular flow voids are preserved. Skull and upper cervical spine: No suspicious marrow lesion. Sinuses/Orbits: Unremarkable orbits. Paranasal sinuses and mastoid air cells are clear. Other: None. IMPRESSION: Negative brain MRI within limitations of motion artifact. Electronically Signed   By: Sebastian Ache M.D.   On: 11/28/2021 11:58  ? ?MR Cervical Spine W or Wo Contrast ? ?Result Date: 11/28/2021 ?CLINICAL DATA:  Bilateral lower extremity numbness beginning 2 days ago with proximal progression to the T8 level. Bilateral leg weakness. EXAM: MRI CERVICAL SPINE WITHOUT AND WITH CONTRAST TECHNIQUE: Multiplanar and multiecho pulse sequences of the cervical spine, to include the craniocervical junction and cervicothoracic junction, were obtained without and with intravenous contrast. CONTRAST:  7.16mL GADAVIST GADOBUTROL 1 MMOL/ML IV SOLN COMPARISON:  None. FINDINGS: The study is motion degraded, including moderate to severe motion artifact on multiple axial sequences. Alignment: Normal. Vertebrae: Diffusely diminished bone marrow T1 signal intensity, nonspecific but can be seen with anemia, smoking, and obesity. No suspicious focal marrow lesion, fracture, significant marrow edema, or evidence of discitis. Cord: Normal signal and morphology. Posterior Fossa, vertebral arteries, paraspinal tissues: Unremarkable. Disc levels: Preserved disc height and hydration throughout the cervical spine. No sizable disc herniation or stenosis. IMPRESSION:  Motion degraded examination. No  disc herniation or stenosis. Normal appearance of the cervical spinal cord. Electronically Signed   By: Sebastian AcheAllen  Grady M.D.   On: 11/28/2021 12:04  ? ?MR THORACIC SPINE W WO CONTRAST ? ?Result Date: 11/28/2021 ?CLINICAL DATA:  Mid-back pain, neuro deficit. Bilateral lower extremity numbness beginning 2 days ago with proximal progression to the T8 level. Bilateral leg weakness. EXAM: MRI THORACIC WITHOUT AND WITH CONTRAST TECHNIQUE: Multiplanar and multiecho pulse sequences of the thoracic spine were obtained without and with intravenous contrast. CONTRAST:  7.575mL GADAVIST GADOBUTROL 1 MMOL/ML IV SOLN COMPARISON:  None. FINDINGS: Alignment:  Mild lower thoracic levoscoliosis.  No listhesis. Vertebrae: Diffusely diminished bone marrow T1 signal intensity, nonspecific though can be seen with anemia, smoking, and obesity. No suspicious focal marrow lesion, fracture, or evidence of discitis. Multiple small Schmorl's nodes in the lower thoracic spine. Fluid in the left T6-7 facet joint with associated mild marrow edema and enhancement and more prominent edema and enhancement in the adjacent paraspinal soft tissues. Abnormal T2 hyperintense dorsal epidural material in the spinal canal extending from T3-T8, greatest from T5-T7 where it measures up to 8 mm in AP thickness. This epidural material largely enhances, however there are a few subcentimeter nonenhancing foci at T6. Cord: The above described epidural process results in severe cord compression at T6, and cord edema is suspected at T6-7. Paraspinal and other soft tissues: Left-sided paraspinal soft tissue inflammation as noted above, extending superiorly greater than inferiorly from the T6-7 facet joint. 1 cm nonenhancing fluid collection posterosuperior to the left T6-7 facet joint. Partially visualized mild bilateral hydronephrosis, right greater than left. Trace left pleural effusion. Disc levels: Severe spinal stenosis from T5-6 to T6-7 by the epidural process.  IMPRESSION: 1. Dorsal epidural collection in the midthoracic spine most consistent with infectious phlegmon/abscess. This results in severe spinal stenosis with cord compression and likely cord edema. 2. Inflammation about the left T6-7 facet joint likely reflecting septic facet arthritis with adjacent 1 cm abscess in the paraspinal soft tissues. Critical Value/emergent results were called by telephone at the time of interpretation on 11/28/2021 at 12:13 pm to Dr. Rubin PayorPickering, who verbally acknowledged these results. Electronically Signed   By: Sebastian AcheAllen  Grady M.D.   On: 11/28/2021 12:18  ? ?DG C-Arm 1-60 Min-No Report ? ?Result Date: 11/28/2021 ?Fluoroscopy was utilized by the requesting physician.  No radiographic interpretation.  ? ?ECHOCARDIOGRAM COMPLETE ? ?Result Date: 11/29/2021 ?   ECHOCARDIOGRAM REPORT   Patient Name:   Crystal Costa Date of Exam: 11/29/2021 Medical Rec #:  [id number]    Height:       60.0 in Accession #:    [id number]   Weight:       160.0 lb Date of Birth:  03/25/1996    BSA:          1.698 m? Patient Age:    25 years     BP:           104/62 mmHg Patient Gender: F            HR:           71 bpm. Exam Location:  Inpatient Procedure: 2D Echo Indications:    Bacteremia  History:        Patient has no prior history of Echocardiogram examinations.  Sonographer:    Devonne DoughtyStacey Plante Referring Phys: [id number] Kathlynn GrateANDREW N WALLACE  Sonographer Comments: Supine. IMPRESSIONS  1. Left ventricular ejection fraction, by estimation, is 60 to 65%. The  left ventricle has normal function. The left ventricle has no regional wall motion abnormalities. Left ventricular diastolic parameters were normal.  2. Right ventricular systolic function is normal. The right ventricular size is normal. There is normal pulmonary artery systolic pressure. The estimated right ventricular systolic pressure is 29.0 mmHg.  3. The mitral valve is normal in structure. No evidence of mitral valve regurgitation. No evidence of mitral stenosis.  4. The  aortic valve is normal in structure. Aortic valve regurgitation is not visualized. No aortic stenosis is present.  5. The inferior vena cava is normal in size with greater than 50% respiratory variability, s

## 2021-11-30 NOTE — Progress Notes (Signed)
Pt has been having c/o of being uncomfortable and staff has been turning twice and hour. Pt was given a bath and repositioned. ?

## 2021-11-30 NOTE — Progress Notes (Addendum)
Physical Therapy Treatment ?Patient Details ?Name: Crystal Costa ?MRN: [id number] ?DOB: 1996/01/15 ?Today's Date: 11/30/2021 ? ? ?History of Present Illness Pt is a 26 y.o. F who presents with acute paraplegia of unknown duration. Imaging showed epidural abscess T5-7. She states she does use heroin. No other significant PMH on file. ? ?  ?PT Comments  ? ? Pt is making steady progress towards her physical therapy goals and verbalized desire to transfer to chair. Emphasis this session on bed mobility, static/dynamic sitting balance and transfer training. Pt still requiring BUE support for static sitting balance, but was able to progress from posterior to anterior propping. Practiced lateral leans onto R/L forearms to prepare for slide board transfer. Pt requiring two person maximal assist for slide board transfer from bed to chair. Re-emphasized use of PRAFO boots and also ordered pt an air mattress for pressure relief management. Pt remains an excellent candidate for AIR based on age and PLOF.  ?  ?Recommendations for follow up therapy are one component of a multi-disciplinary discharge planning process, led by the attending physician.  Recommendations may be updated based on patient status, additional functional criteria and insurance authorization. ? ?Follow Up Recommendations ? Acute inpatient rehab (3hours/day) ?  ?  ?Assistance Recommended at Discharge Frequent or constant Supervision/Assistance  ?Patient can return home with the following Two people to help with walking and/or transfers;A lot of help with bathing/dressing/bathroom;Assistance with cooking/housework;Assist for transportation;Help with stairs or ramp for entrance ?  ?Equipment Recommendations ? Wheelchair (measurements PT);Wheelchair cushion (measurements PT);Hospital bed;BSC/3in1;Other (comment) (hoyer lift)  ?  ?Recommendations for Other Services Rehab consult ? ? ?  ?Precautions / Restrictions Precautions ?Precautions: Fall;Other  (comment);Back ?Precaution Booklet Issued: No ?Precaution Comments: hemovac, paraplegia, foley ?Restrictions ?Weight Bearing Restrictions: No  ?  ? ?Mobility ? Bed Mobility ?Overal bed mobility: Needs Assistance ?Bed Mobility: Supine to Sit ?  ?  ?Supine to sit: Max assist, +2 for physical assistance ?  ?  ?General bed mobility comments: max A +2 to EOB. pt demonstrating much improved initiation, reaching with LUE for rail and pushing to upright. ?  ? ?Transfers ?Overall transfer level: Needs assistance ?Equipment used: Sliding board ?Transfers: Bed to chair/wheelchair/BSC ?  ?  ?  ?  ?  ? Lateral/Scoot Transfers: +2 physical assistance, Max assist ?General transfer comment: Manual assist for BLE positioning, assist to laterally lean onto L elbow while +2 set up slide board. Cues for head/hip relationhip and hand positioning on board. Pt required multiple scoots and needs cues for anterior vs posterior lean ?  ? ?Ambulation/Gait ?  ?  ?  ?  ?  ?  ?  ?General Gait Details: unable due to paraplegia ? ? ?Stairs ?  ?  ?  ?  ?  ? ? ?Wheelchair Mobility ?  ? ?Modified Rankin (Stroke Patients Only) ?  ? ? ?  ?Balance Overall balance assessment: Needs assistance ?Sitting-balance support: Feet supported ?Sitting balance-Leahy Scale: Poor ?Sitting balance - Comments: pt able to support self in sitting with close min guard A +2, with hands posteriorly on bed ?  ?  ?  ?  ?  ?  ?  ?  ?  ?  ?  ?  ?  ?  ?  ?  ? ?  ?Cognition Arousal/Alertness: Awake/alert ?Behavior During Therapy: Flat affect, Anxious ?Overall Cognitive Status: Within Functional Limits for tasks assessed ?  ?  ?  ?  ?  ?  ?  ?  ?  ?  ?  ?  ?  ?  ?  ?  ?  ?  ?  ? ?  ?  Exercises Other Exercises ?Other Exercises: Sitting: progression from posterior to anterior prop, cervical rotations to L/R, cervical flexion/extension, lateral leans onto elbows ? ?  ?General Comments General comments (skin integrity, edema, etc.): BP WNL throughout session ?  ?  ? ?Pertinent  Vitals/Pain Pain Assessment ?Pain Assessment: Faces ?Faces Pain Scale: Hurts even more ?Pain Location: BLE's, back ?Pain Descriptors / Indicators: Burning, Discomfort, Constant ?Pain Intervention(s): Limited activity within patient's tolerance, Monitored during session, Premedicated before session, Repositioned  ? ? ?Home Living   ?  ?  ?  ?  ?  ?  ?  ?  ?  ?   ?  ?Prior Function    ?  ?  ?   ? ?PT Goals (current goals can now be found in the care plan section) Acute Rehab PT Goals ?Patient Stated Goal: did not state ?Potential to Achieve Goals: Good ?Progress towards PT goals: Progressing toward goals ? ?  ?Frequency ? ? ? Min 4X/week ? ? ? ?  ?PT Plan Current plan remains appropriate  ? ? ?Co-evaluation PT/OT/SLP Co-Evaluation/Treatment: Yes ?Reason for Co-Treatment: Complexity of the patient's impairments (multi-system involvement);For patient/therapist safety;To address functional/ADL transfers ?PT goals addressed during session: Mobility/safety with mobility;Balance ?  ?  ? ?  ?AM-PAC PT "6 Clicks" Mobility   ?Outcome Measure ? Help needed turning from your back to your side while in a flat bed without using bedrails?: A Lot ?Help needed moving from lying on your back to sitting on the side of a flat bed without using bedrails?: Total ?Help needed moving to and from a bed to a chair (including a wheelchair)?: Total ?Help needed standing up from a chair using your arms (e.g., wheelchair or bedside chair)?: Total ?Help needed to walk in hospital room?: Total ?Help needed climbing 3-5 steps with a railing? : Total ?6 Click Score: 7 ? ?  ?End of Session Equipment Utilized During Treatment: Gait belt ?Activity Tolerance: Patient tolerated treatment well ?Patient left: in chair;with call bell/phone within reach;with chair alarm set ?Nurse Communication: Mobility status;Need for lift equipment ?PT Visit Diagnosis: Other abnormalities of gait and mobility (R26.89);Pain ?Pain - part of body: Leg ?  ? ? ?Time:  [id number] ?PT Time Calculation (min) (ACUTE ONLY): 25 min ? ?Charges:  $Therapeutic Activity: 8-22 mins          ?          ? ?Wyona Almas, PT, DPT ?Acute Rehabilitation Services ?Pager [phone] ?Office [phone] ? ? ? ?Deno Etienne ?11/30/2021, 4:53 PM ? ?

## 2021-12-01 ENCOUNTER — Encounter (HOSPITAL_COMMUNITY): Payer: Self-pay | Admitting: Anesthesiology

## 2021-12-01 LAB — HCV RNA QUANT
HCV Quantitative Log: 5.769 log10 IU/mL (ref >1.70)
HCV Quantitative: 587000 IU/mL (ref >50)

## 2021-12-01 LAB — CULTURE, BLOOD (ROUTINE X 2)
Special Requests: ADEQUATE
Special Requests: ADEQUATE

## 2021-12-01 LAB — RPR: RPR Ser Ql: NONREACTIVE

## 2021-12-01 NOTE — Progress Notes (Signed)
Subjective: ?Patient reports some burning in L leg ? ?Objective: ?Vital signs in last 24 hours: ?Temp:  [97.6 ?F (36.4 ?C)-98.4 ?F (36.9 ?C)] 98.4 ?F (36.9 ?C) (04/06 [id number]) ?Pulse Rate:  [70] 70 (04/06 0726) ?Resp:  [16-19] 16 (04/06 0726) ?BP: (109-124)/(71-78) 124/78 (04/06 0726) ?SpO2:  [99 %-100 %] 100 % (04/06 0726) ? ?Intake/Output from previous day: ?04/05 0701 - 04/06 0700 ?In: -  ?Out: 950 [Urine:950] ?Intake/Output this shift: ?No intake/output data recorded. ? ?Neurologic: Grossly normal UEs but plegic in lowers, has gross touch and proprioception in legs ? ?Lab Results: ?Lab Results  ?Component Value Date  ? WBC 23.3 (H) 11/29/2021  ? HGB 11.1 (L) 11/29/2021  ? HCT 33.4 (L) 11/29/2021  ? MCV 87.0 11/29/2021  ? PLT 443 (H) 11/29/2021  ? ?Lab Results  ?Component Value Date  ? INR 1.2 11/28/2021  ? ?BMET ?Lab Results  ?Component Value Date  ? NA 136 11/29/2021  ? K 5.0 11/29/2021  ? CL 103 11/29/2021  ? CO2 23 11/29/2021  ? GLUCOSE 190 (H) 11/29/2021  ? BUN 15 11/29/2021  ? CREATININE 0.71 11/29/2021  ? CALCIUM 8.6 (L) 11/29/2021  ? ? ?Studies/Results: ?ECHOCARDIOGRAM COMPLETE ? ?Result Date: 11/29/2021 ?   ECHOCARDIOGRAM REPORT   Patient Name:   Crystal Costa Date of Exam: 11/29/2021 Medical Rec #:  [id number]    Height:       60.0 in Accession #:    [id number]   Weight:       160.0 lb Date of Birth:  20-Jan-1996    BSA:          1.698 m? Patient Age:    25 years     BP:           104/62 mmHg Patient Gender: F            HR:           71 bpm. Exam Location:  Inpatient Procedure: 2D Echo Indications:    Bacteremia  History:        Patient has no prior history of Echocardiogram examinations.  Sonographer:    Arlyss Gandy Referring Phys: [id number] Mignon Pine  Sonographer Comments: Supine. IMPRESSIONS  1. Left ventricular ejection fraction, by estimation, is 60 to 65%. The left ventricle has normal function. The left ventricle has no regional wall motion abnormalities. Left ventricular diastolic parameters were  normal.  2. Right ventricular systolic function is normal. The right ventricular size is normal. There is normal pulmonary artery systolic pressure. The estimated right ventricular systolic pressure is 0000000 mmHg.  3. The mitral valve is normal in structure. No evidence of mitral valve regurgitation. No evidence of mitral stenosis.  4. The aortic valve is normal in structure. Aortic valve regurgitation is not visualized. No aortic stenosis is present.  5. The inferior vena cava is normal in size with greater than 50% respiratory variability, suggesting right atrial pressure of 3 mmHg. Conclusion(s)/Recommendation(s): No evidence of valvular vegetations on this transthoracic echocardiogram. Consider a transesophageal echocardiogram to exclude infective endocarditis if clinically indicated. FINDINGS  Left Ventricle: Left ventricular ejection fraction, by estimation, is 60 to 65%. The left ventricle has normal function. The left ventricle has no regional wall motion abnormalities. The left ventricular internal cavity size was normal in size. There is  no left ventricular hypertrophy. Left ventricular diastolic parameters were normal. Right Ventricle: The right ventricular size is normal. No increase in right ventricular wall thickness. Right ventricular systolic function is normal. There  is normal pulmonary artery systolic pressure. The tricuspid regurgitant velocity is 2.55 m/s, and  with an assumed right atrial pressure of 3 mmHg, the estimated right ventricular systolic pressure is 0000000 mmHg. Left Atrium: Left atrial size was normal in size. Right Atrium: Right atrial size was normal in size. Pericardium: There is no evidence of pericardial effusion. Mitral Valve: The mitral valve is normal in structure. No evidence of mitral valve regurgitation. No evidence of mitral valve stenosis. Tricuspid Valve: The tricuspid valve is normal in structure. Tricuspid valve regurgitation is mild . No evidence of tricuspid stenosis.  Aortic Valve: The aortic valve is normal in structure. Aortic valve regurgitation is not visualized. No aortic stenosis is present. Aortic valve mean gradient measures 7.0 mmHg. Aortic valve peak gradient measures 13.5 mmHg. Aortic valve area, by VTI measures 2.89 cm?. Pulmonic Valve: The pulmonic valve was normal in structure. Pulmonic valve regurgitation is not visualized. No evidence of pulmonic stenosis. Aorta: The aortic root is normal in size and structure. Venous: The inferior vena cava is normal in size with greater than 50% respiratory variability, suggesting right atrial pressure of 3 mmHg. IAS/Shunts: No atrial level shunt detected by color flow Doppler.  LEFT VENTRICLE PLAX 2D LVIDd:         4.90 cm   Diastology LVIDs:         3.10 cm   LV e' medial:    11.50 cm/s LV PW:         0.80 cm   LV E/e' medial:  9.7 LV IVS:        0.80 cm   LV e' lateral:   13.40 cm/s LVOT diam:     2.00 cm   LV E/e' lateral: 8.3 LV SV:         111 LV SV Index:   65 LVOT Area:     3.14 cm?  RIGHT VENTRICLE             IVC RV Basal diam:  4.10 cm     IVC diam: 1.80 cm RV Mid diam:    3.70 cm RV S prime:     12.80 cm/s TAPSE (M-mode): 2.0 cm LEFT ATRIUM             Index        RIGHT ATRIUM           Index LA diam:        3.10 cm 1.83 cm/m?   RA Area:     18.10 cm? LA Vol (A2C):   41.9 ml 24.68 ml/m?  RA Volume:   49.90 ml  29.39 ml/m? LA Vol (A4C):   43.6 ml 25.68 ml/m? LA Biplane Vol: 47.1 ml 27.74 ml/m?  AORTIC VALVE                     PULMONIC VALVE AV Area (Vmax):    2.97 cm?      PV Vmax:       1.19 m/s AV Area (Vmean):   2.64 cm?      PV Peak grad:  5.7 mmHg AV Area (VTI):     2.89 cm? AV Vmax:           184.00 cm/s AV Vmean:          125.000 cm/s AV VTI:            0.382 m AV Peak Grad:      13.5 mmHg AV Mean Grad:  7.0 mmHg LVOT Vmax:         174.00 cm/s LVOT Vmean:        105.000 cm/s LVOT VTI:          0.352 m LVOT/AV VTI ratio: 0.92  AORTA Ao Root diam: 2.90 cm MITRAL VALVE                TRICUSPID VALVE MV Area  (PHT): 3.91 cm?     TR Peak grad:   26.0 mmHg MV Decel Time: 194 msec     TR Vmax:        255.00 cm/s MV E velocity: 111.00 cm/s MV A velocity: 54.00 cm/s   SHUNTS MV E/A ratio:  2.06         Systemic VTI:  0.35 m                             Systemic Diam: 2.00 cm Dani Gobble Croitoru MD Electronically signed by Sanda Klein MD Signature Date/Time: 11/29/2021/3:37:38 PM    Final    ? ?Assessment/Plan: ?Epidural abscess - continue ABX, PT/OT. Needs rehab. Insurance assistance. Mobilize as tolerated ? ?Estimated body mass index is 31.25 kg/m? as calculated from the following: ?  Height as of this encounter: 5' (1.524 m). ?  Weight as of this encounter: 72.6 kg. ? ? ? LOS: 3 days  ? ? ?Crystal Costa ?12/01/2021, 8:04 AM ? ? ? ? ?

## 2021-12-01 NOTE — Progress Notes (Signed)
Inpatient Rehab Admissions Coordinator:  ? ?Discussed with Dr. Naaman Plummer.  We cannot accept at this time due to IVDU and need for IV abx for the next 6-8 weeks.  However, we will rescreen when closer to discharge if pt remains in house to see if she is a candidate at that time.  (In the next 3-4 weeks).  ? ?Shann Medal, PT, DPT ?Admissions Coordinator ?[phone] ?12/01/21  ?9:50 AM ? ?

## 2021-12-01 NOTE — Progress Notes (Signed)
?  ? ? ?  Shared Decision Making/Informed Consent ? ?The risks [esophageal damage, perforation (1:10,000 risk), bleeding, pharyngeal hematoma as well as other potential complications associated with conscious sedation including aspiration, arrhythmia, respiratory failure and death], benefits (treatment guidance and diagnostic support) and alternatives of a transesophageal echocardiogram were discussed in detail with Crystal Costa and she is willing to proceed.   ? ?NPO after midnight, TEE 12/02/21 at 0830 AM.  ?

## 2021-12-01 NOTE — Progress Notes (Signed)
Occupational Therapy Treatment ?Patient Details ?Name: Crystal Costa ?MRN: [id number] ?DOB: 1995-10-21 ?Today's Date: 12/01/2021 ? ? ?History of present illness Pt is a 26 y.o. female admitted 11/28/21 with acute paraplegia of unknown duration. Imaging showed epidural abscess T5-7. She states she does use heroin. S/p thoracic laminectomy T5-T7 for evacuation of epidural abscess on 4/3. No other significant PMH on file. ?  ?OT comments ? Patient received in bed and agreeable to OT/PT treatment. Patient was max assist to donn pants at bed level with patient assisting with pulling up clothing and rolling. Patient provided leg lifter and education on use.  Patient was able to use leg lifter to get legs off side of bed and demonstrated improvement with getting from supine to sitting with assistance of one and min guard of another. Patient was educated on slide board placement and hand positioning for slide board transfers with max assist +2.  Patient making good progress and will continue to be followed by acute OT.   ? ?Recommendations for follow up therapy are one component of a multi-disciplinary discharge planning process, led by the attending physician.  Recommendations may be updated based on patient status, additional functional criteria and insurance authorization. ?   ?Follow Up Recommendations ? Acute inpatient rehab (3hours/day)  ?  ?Assistance Recommended at Discharge Intermittent Supervision/Assistance  ?Patient can return home with the following ? Two people to help with walking and/or transfers;Two people to help with bathing/dressing/bathroom;Assistance with cooking/housework;Assist for transportation;Help with stairs or ramp for entrance ?  ?Equipment Recommendations ? Wheelchair (measurements OT);Wheelchair cushion (measurements OT);Tub/shower bench;Hospital bed;BSC/3in1;Other (comment)  ?  ?Recommendations for Other Services   ? ?  ?Precautions / Restrictions Precautions ?Precautions: Fall;Other  (comment);Back ?Precaution Booklet Issued: No ?Precaution Comments: hemovac, paraplegia, foley ?Restrictions ?Weight Bearing Restrictions: No  ? ? ?  ? ?Mobility Bed Mobility ?Overal bed mobility: Needs Assistance ?Bed Mobility: Supine to Sit ?  ?  ?Supine to sit: Mod assist (min guard of another) ?  ?  ?General bed mobility comments: patient provided leg lifter and instruction on use and patient was able to perform supine to sit with mod assist of one and min guard for safety of another ?  ? ?Transfers ?Overall transfer level: Needs assistance ?Equipment used: Sliding board ?Transfers: Bed to chair/wheelchair/BSC ?  ?  ?  ?  ?  ? Lateral/Scoot Transfers: +2 physical assistance, Max assist ?General transfer comment: education on board placement and hand positioning for scooting into chair with board. ?  ?  ?Balance Overall balance assessment: Needs assistance ?Sitting-balance support: Feet supported ?Sitting balance-Leahy Scale: Poor ?Sitting balance - Comments: min guard +2 to min assist for sitting balance on EOB ?  ?  ?  ?  ?  ?  ?  ?  ?  ?  ?  ?  ?  ?  ?  ?   ? ?ADL either performed or assessed with clinical judgement  ? ?ADL Overall ADL's : Needs assistance/impaired ?  ?  ?  ?  ?  ?  ?  ?  ?  ?  ?Lower Body Dressing: Maximal assistance;Bed level ?Lower Body Dressing Details (indicate cue type and reason): donned pants with patient assisting with rolling in bed and assisting with pulling up ?  ?  ?  ?  ?  ?  ?  ?  ?  ? ?Extremity/Trunk Assessment   ?  ?  ?  ?  ?  ? ?Vision   ?  ?  ?Perception   ?  ?  Praxis   ?  ? ?Cognition Arousal/Alertness: Awake/alert ?Behavior During Therapy: Flat affect, Anxious ?Overall Cognitive Status: Within Functional Limits for tasks assessed ?  ?  ?  ?  ?  ?  ?  ?  ?  ?  ?  ?  ?  ?  ?  ?  ?General Comments: demonstrated good carryover for bed mobility ?  ?  ?   ?Exercises   ? ?  ?Shoulder Instructions   ? ? ?  ?General Comments    ? ? ?Pertinent Vitals/ Pain       Pain  Assessment ?Pain Assessment: Faces ?Faces Pain Scale: Hurts little more ?Pain Location: BLE's, back ?Pain Descriptors / Indicators: Burning, Discomfort, Constant ?Pain Intervention(s): Limited activity within patient's tolerance, Monitored during session ? ?Home Living   ?  ?  ?  ?  ?  ?  ?  ?  ?  ?  ?  ?  ?  ?  ?  ?  ?  ?  ? ?  ?Prior Functioning/Environment    ?  ?  ?  ?   ? ?Frequency ? Min 3X/week  ? ? ? ? ?  ?Progress Toward Goals ? ?OT Goals(current goals can now be found in the care plan section) ? Progress towards OT goals: Progressing toward goals ? ?Acute Rehab OT Goals ?Patient Stated Goal: get better ?OT Goal Formulation: With patient ?Time For Goal Achievement: 12/13/21 ?Potential to Achieve Goals: Fair ?ADL Goals ?Pt Will Perform Grooming: with modified independence;bed level;sitting ?Pt Will Perform Upper Body Bathing: with modified independence;bed level;sitting ?Pt Will Transfer to Toilet: with transfer board;with min assist;bedside commode;with mod assist  ?Plan Discharge plan remains appropriate;Frequency remains appropriate   ? ?Co-evaluation ? ? ? PT/OT/SLP Co-Evaluation/Treatment: Yes ?Reason for Co-Treatment: For patient/therapist safety;To address functional/ADL transfers ?  ?OT goals addressed during session: ADL's and self-care ?  ? ?  ?AM-PAC OT "6 Clicks" Daily Activity     ?Outcome Measure ? ? Help from another person eating meals?: None ?Help from another person taking care of personal grooming?: A Little ?Help from another person toileting, which includes using toliet, bedpan, or urinal?: Total ?Help from another person bathing (including washing, rinsing, drying)?: A Lot ?Help from another person to put on and taking off regular upper body clothing?: A Lot ?Help from another person to put on and taking off regular lower body clothing?: Total ?6 Click Score: 13 ? ?  ?End of Session Equipment Utilized During Treatment: Other (comment) (slide board) ? ?OT Visit Diagnosis: Muscle weakness  (generalized) (M62.81);Other symptoms and signs involving the nervous system (R29.898) ?  ?Activity Tolerance Patient tolerated treatment well ?  ?Patient Left in chair;with call bell/phone within reach;with chair alarm set;with family/visitor present ?  ?Nurse Communication Mobility status;Need for lift equipment ?  ? ?   ? ?Time: 5093-2671 ?OT Time Calculation (min): 28 min ? ?Charges: OT General Charges ?$OT Visit: 1 Visit ?OT Treatments ?$Self Care/Home Management : 8-22 mins ? ?Alfonse Flavors, OTA ?Acute Rehabilitation Services  ?Pager [phone] ?Office [phone] ? ? ?Dewain Penning ?12/01/2021, 11:32 AM ?

## 2021-12-01 NOTE — Progress Notes (Signed)
Physical Therapy Treatment ?Patient Details ?Name: Crystal Costa ?MRN: [id number] ?DOB: 02/20/1996 ?Today's Date: 12/01/2021 ? ? ?History of Present Illness Pt is a 26 y.o. female admitted 11/28/21 with acute paraplegia of unknown duration. 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 S/p thoracic laminectomy T5-T7 for evacuation of epidural abscess on 4/3. No other significant PMH on file. ?  ?PT Comments  ? ? Pt progressing with mobility. Today's session focused on bed mobility and slide board transfer. Pt demonstrates good carryover of education from previous sessions; pleasant and motivated to participate. Noted light touch sensation throughout BLEs, no significant muscle activation noted. Pt remains limited by paraplegia, poor balance strategies/postural reactions and decreased activity tolerance. Continue to recommend intensive AIR-level therapies to maximize functional mobility and independence prior to return home. ?   ?Recommendations for follow up therapy are one component of a multi-disciplinary discharge planning process, led by the attending physician.  Recommendations may be updated based on patient status, additional functional criteria and insurance authorization. ? ?Follow Up Recommendations ? Acute inpatient rehab (3hours/day) ?  ?  ?Assistance Recommended at Discharge Frequent or constant Supervision/Assistance  ?Patient can return home with the following Two people to help with walking and/or transfers;A lot of help with bathing/dressing/bathroom;Assistance with cooking/housework;Assist for transportation;Help with stairs or ramp for entrance ?  ?Equipment Recommendations ? Wheelchair (measurements PT);Wheelchair cushion (measurements PT);Hospital bed;BSC/3in1;Hoyer lift ?  ?Recommendations for Other Services Rehab consult ? ? ?  ?Precautions / Restrictions Precautions ?Precautions: Back;Fall;Other (comment) ?Precaution Booklet Issued: No ?Precaution Comments: hemovac,  paraplegia, foley ?Restrictions ?Weight Bearing Restrictions: No  ?  ? ?Mobility ? Bed Mobility ?Overal bed mobility: Needs Assistance ?  ?  ?  ?Supine to sit: Mod assist, +2 for safety/equipment ?  ?  ?General bed mobility comments: patient provided leg lifter and instruction on use and patient was able to perform supine to sit with modA+1, close min guard from +2 assist due to poor trunk control coming to sit EOB ?  ? ?Transfers ?Overall transfer level: Needs assistance ?Equipment used: Sliding board ?Transfers: Bed to chair/wheelchair/BSC ?  ?  ?  ?  ?  ? Lateral/Scoot Transfers: Max assist, +2 physical assistance, +2 safety/equipment, With slide board ?General transfer comment: education on board placement and hand positioning for scooting into chair with board; pt with poor control resulting in hips sliding forwards from edge of bed/board, maxA+2 for trunk control and BLE repositioning; pt reports noticing improvement in ability to transfer ?  ? ?Ambulation/Gait ?  ?  ?  ?  ?  ?  ?  ?  ? ? ?Stairs ?  ?  ?  ?  ?  ? ? ?Wheelchair Mobility ?  ? ?Modified Rankin (Stroke Patients Only) ?  ? ? ?  ?Balance Overall balance assessment: Needs assistance ?Sitting-balance support: Feet supported, Single extremity supported, Bilateral upper extremity supported ?Sitting balance-Leahy Scale: Poor ?Sitting balance - Comments: reliant on UE support to maintain static sitting; intermittent LOB requiring min-modA to correct ?  ?  ?  ?  ?  ?  ?  ?  ?  ?  ?  ?  ?  ?  ?  ?  ? ?  ?Cognition Arousal/Alertness: Awake/alert ?Behavior During Therapy: [facility] for tasks assessed/performed, Flat affect ?Overall Cognitive Status: Within Functional Limits for tasks assessed ?  ?  ?  ?  ?  ?  ?  ?  ?  ?  ?  ?  ?  ?  ?  ?  ?  General Comments: demonstrated good carryover for bed mobility; motivated to participate and taking directions well ?  ?  ? ?  ?Exercises   ? ?  ?General Comments General comments (skin integrity, edema, etc.): pt's boyfriend  Crystal Hai) present during session. BLE light touch sensation grossly in tact; noted minimal R hamstring activation with gravity-assisted knee flexion, pt unable to recreate; otherwise no active movement noted in BLEs ?  ?  ? ?Pertinent Vitals/Pain Pain Assessment ?Pain Assessment: Faces ?Faces Pain Scale: Hurts little more ?Pain Location: back ?Pain Descriptors / Indicators: Burning, Discomfort ?Pain Intervention(s): Monitored during session, Repositioned  ? ? ?Home Living   ?  ?  ?  ?  ?  ?  ?  ?  ?  ?   ?  ?Prior Function    ?  ?  ?   ? ?PT Goals (current goals can now be found in the care plan section) Progress towards PT goals: Progressing toward goals ? ?  ?Frequency ? ? ? Min 4X/week ? ? ? ?  ?PT Plan Current plan remains appropriate  ? ? ?Co-evaluation PT/OT/SLP Co-Evaluation/Treatment: Yes ?Reason for Co-Treatment: Complexity of the patient's impairments (multi-system involvement);For patient/therapist safety;To address functional/ADL transfers ?PT goals addressed during session: Mobility/safety with mobility;Balance;Proper use of DME ?OT goals addressed during session: ADL's and self-care ?  ? ?  ?AM-PAC PT "6 Clicks" Mobility   ?Outcome Measure ? Help needed turning from your back to your side while in a flat bed without using bedrails?: A Lot ?Help needed moving from lying on your back to sitting on the side of a flat bed without using bedrails?: A Lot ?Help needed moving to and from a bed to a chair (including a wheelchair)?: Total ?Help needed standing up from a chair using your arms (e.g., wheelchair or bedside chair)?: Total ?Help needed to walk in hospital room?: Total ?Help needed climbing 3-5 steps with a railing? : Total ?6 Click Score: 8 ? ?  ?End of Session   ?Activity Tolerance: Patient tolerated treatment well ?Patient left: in chair;with call bell/phone within reach;with chair alarm set ?Nurse Communication: Mobility status;Need for lift equipment ?PT Visit Diagnosis: Other abnormalities of gait  and mobility (R26.89);Pain ?  ? ? ?Time: 5361-4431 ?PT Time Calculation (min) (ACUTE ONLY): 28 min ? ?Charges:  $Therapeutic Activity: 8-22 mins          ?          ? ?Ina Homes, PT, DPT ?Acute Rehabilitation Services  ?Pager [phone] ?Office [phone] ? ?Malachy Chamber ?12/01/2021, 1:51 PM ? ?

## 2021-12-01 NOTE — Anesthesia Preprocedure Evaluation (Deleted)
Anesthesia Evaluation  ? ? ?Reviewed: ?Allergy & Precautions, Patient's Chart, lab work & pertinent test results ? ?Airway ? ? ? ? ? ? ? Dental ? ?(+) Poor Dentition ?  ?Pulmonary ?neg pulmonary ROS, Current Smoker,  ?  ?Pulmonary exam normal ? ? ? ? ? ? ? Cardiovascular ?negative cardio ROS ? ? ? ? ?  ?Neuro/Psych ?S/p surgery for epidural abscess Bilateral leg weakness ?  ? GI/Hepatic ?negative GI ROS, (+)  ?  ? substance abuse ? IV drug use,   ?Endo/Other  ?negative endocrine ROS ? Renal/GU ?negative Renal ROS  ? ?  ?Musculoskeletal ? ? Abdominal ?(+) + obese,   ?Peds ? Hematology ?negative hematology ROS ?(+)   ?Anesthesia Other Findings ? ? Reproductive/Obstetrics ?hcg negative ? ?  ? ? ? ? ? ? ? ? ? ? ? ? ? ?  ?  ? ? ? ? ? ? ? ? ?Anesthesia Physical ? ?Anesthesia Plan ? ?ASA: 3 ? ?Anesthesia Plan: MAC  ? ?Post-op Pain Management:   ? ?Induction:  ? ?PONV Risk Score and Plan: 3 and Ondansetron, Midazolam and Propofol infusion ? ?Airway Management Planned: Oral ETT and Natural Airway ? ?Additional Equipment:  ? ?Intra-op Plan:  ? ?Post-operative Plan:  ? ?Informed Consent:  ? ?Plan Discussed with:  ? ?Anesthesia Plan Comments:   ? ? ? ? ? ? ?Anesthesia Quick Evaluation ? ?

## 2021-12-01 NOTE — Progress Notes (Signed)
?   ? ?[facility] ? ?Date of Admission:  11/28/2021    ?       ?Reason for visit: Follow up on MSSA bacteremia ? ?Current antibiotics: ?Cefazolin 4/4 -- pres ?  ?Previous antibiotics: ?Vancomycin 4/3 -- pres ?Ceftriaxone 4/3 -- pres ? ?ASSESSMENT:   ? ?26 y.o. female admitted with: ? ?MSS bacteremia: Blood cx from 4/3 positive and repeat cx 4/5 NGTD. ?T5-T7 epidural abscess with resultant paraplegia: S/P thoracic laminectomy 4/3 with NSGY.  Cx also with MSSA. ?HCV Ab positive: RNA pending. ?Sepsis: Due to #1 and #2.  Improved. ?SUD ? ?RECOMMENDATIONS:   ? ?Continue Ancef ?Follow cx ?TEE ?Labs monitoring ?Follow HCV RNA ?Will follow ? ? ?Principal Problem: ?  MSSA bacteremia ?Active Problems: ?  Epidural abscess ?  Paraplegia (HCC) ?  Sepsis due to methicillin susceptible Staphylococcus aureus (HCC) ?  Chronic viral hepatitis C (HCC) ?  Substance use disorder ? ? ? ?MEDICATIONS:   ? ?Scheduled Meds: ? celecoxib  200 mg Oral Q12H  ? Chlorhexidine Gluconate Cloth  6 each Topical Daily  ? gabapentin  300 mg Oral TID  ? mupirocin ointment   Nasal BID  ? pantoprazole  40 mg Oral Q24H  ? senna  1 tablet Oral BID  ? sodium chloride flush  3 mL Intravenous Q12H  ? ?Continuous Infusions: ? sodium chloride 250 mL (11/28/21 1950)  ? 0.9 % NaCl with KCl 20 mEq / L 75 mL/hr at 11/28/21 1958  ?  ceFAZolin (ANCEF) IV 2 g (12/01/21 2694)  ? methocarbamol (ROBAXIN) IV    ? ?PRN Meds:.menthol-cetylpyridinium **OR** phenol, methocarbamol **OR** methocarbamol (ROBAXIN) IV, ondansetron **OR** ondansetron (ZOFRAN) IV, oxyCODONE, sodium chloride flush, sodium phosphate ? ?SUBJECTIVE:  ? ?24 hour events:  ?No acute events noted ? ?Has some sensation in legs but no ability to move them ?Working with PT ?Afebrile ?Tolerating abx ?TEE tmrw ? ?Review of Systems  ?All other systems reviewed and are negative. ? ?  ?OBJECTIVE:  ? ?Blood pressure 124/78, pulse 70, temperature 98.4 ?F (36.9 ?C), temperature source Oral,  resp. rate 16, height 5' (1.524 m), weight 72.6 kg, last menstrual period 10/30/2021, SpO2 100 %. ?Body mass index is 31.25 kg/m?. ? ?Physical Exam ?Constitutional:   ?   General: She is not in acute distress. ?   Appearance: Normal appearance.  ?HENT:  ?   Head: Normocephalic and atraumatic.  ?Pulmonary:  ?   Effort: Pulmonary effort is normal. No respiratory distress.  ?Abdominal:  ?   General: There is no distension.  ?   Palpations: Abdomen is soft.  ?Musculoskeletal:  ?   Comments: Sensation to light touch bilateral LE intact ?Remains plegic b/l LE ?Foley in place ?  ?Skin: ?   General: Skin is warm and dry.  ?Neurological:  ?   General: No focal deficit present.  ?   Mental Status: She is alert and oriented to person, place, and time.  ?   Motor: Weakness present.  ?Psychiatric:     ?   Mood and Affect: Mood normal.     ?   Behavior: Behavior normal.  ? ? ? ?Lab Results: ?Lab Results  ?Component Value Date  ? WBC 23.3 (H) 11/29/2021  ? HGB 11.1 (L) 11/29/2021  ? HCT 33.4 (L) 11/29/2021  ? MCV 87.0 11/29/2021  ? PLT 443 (H) 11/29/2021  ?  ?Lab Results  ?Component Value Date  ? NA 136 11/29/2021  ? K 5.0 11/29/2021  ?  CO2 23 11/29/2021  ? GLUCOSE 190 (H) 11/29/2021  ? BUN 15 11/29/2021  ? CREATININE 0.71 11/29/2021  ? CALCIUM 8.6 (L) 11/29/2021  ? GFRNONAA >60 11/29/2021  ?  ?Lab Results  ?Component Value Date  ? ALT 33 11/28/2021  ? AST 29 11/28/2021  ? ALKPHOS 102 11/28/2021  ? BILITOT 0.5 11/28/2021  ? ? ?   ?Component Value Date/Time  ? CRP 19.3 (H) 11/28/2021 1754  ? ? ?   ?Component Value Date/Time  ? ESRSEDRATE 82 (H) 11/28/2021 1754  ? ?  ?I have reviewed the micro and lab results in Epic. ? ?Imaging: ?ECHOCARDIOGRAM COMPLETE ? ?Result Date: 11/29/2021 ?   ECHOCARDIOGRAM REPORT   Patient Name:   Crystal Costa Date of Exam: 11/29/2021 Medical Rec #:  [id number]    Height:       60.0 in Accession #:    [id number]   Weight:       160.0 lb Date of Birth:  11/01/1995    BSA:          1.698 m? Patient Age:    25  years     BP:           104/62 mmHg Patient Gender: F            HR:           71 bpm. Exam Location:  Inpatient Procedure: 2D Echo Indications:    Bacteremia  History:        Patient has no prior history of Echocardiogram examinations.  Sonographer:    Devonne DoughtyStacey Plante Referring Phys: [id number] Kathlynn GrateANDREW N Rontae Inglett  Sonographer Comments: Supine. IMPRESSIONS  1. Left ventricular ejection fraction, by estimation, is 60 to 65%. The left ventricle has normal function. The left ventricle has no regional wall motion abnormalities. Left ventricular diastolic parameters were normal.  2. Right ventricular systolic function is normal. The right ventricular size is normal. There is normal pulmonary artery systolic pressure. The estimated right ventricular systolic pressure is 29.0 mmHg.  3. The mitral valve is normal in structure. No evidence of mitral valve regurgitation. No evidence of mitral stenosis.  4. The aortic valve is normal in structure. Aortic valve regurgitation is not visualized. No aortic stenosis is present.  5. The inferior vena cava is normal in size with greater than 50% respiratory variability, suggesting right atrial pressure of 3 mmHg. Conclusion(s)/Recommendation(s): No evidence of valvular vegetations on this transthoracic echocardiogram. Consider a transesophageal echocardiogram to exclude infective endocarditis if clinically indicated. FINDINGS  Left Ventricle: Left ventricular ejection fraction, by estimation, is 60 to 65%. The left ventricle has normal function. The left ventricle has no regional wall motion abnormalities. The left ventricular internal cavity size was normal in size. There is  no left ventricular hypertrophy. Left ventricular diastolic parameters were normal. Right Ventricle: The right ventricular size is normal. No increase in right ventricular wall thickness. Right ventricular systolic function is normal. There is normal pulmonary artery systolic pressure. The tricuspid regurgitant  velocity is 2.55 m/s, and  with an assumed right atrial pressure of 3 mmHg, the estimated right ventricular systolic pressure is 29.0 mmHg. Left Atrium: Left atrial size was normal in size. Right Atrium: Right atrial size was normal in size. Pericardium: There is no evidence of pericardial effusion. Mitral Valve: The mitral valve is normal in structure. No evidence of mitral valve regurgitation. No evidence of mitral valve stenosis. Tricuspid Valve: The tricuspid valve is normal in structure. Tricuspid valve regurgitation is mild .  No evidence of tricuspid stenosis. Aortic Valve: The aortic valve is normal in structure. Aortic valve regurgitation is not visualized. No aortic stenosis is present. Aortic valve mean gradient measures 7.0 mmHg. Aortic valve peak gradient measures 13.5 mmHg. Aortic valve area, by VTI measures 2.89 cm?. Pulmonic Valve: The pulmonic valve was normal in structure. Pulmonic valve regurgitation is not visualized. No evidence of pulmonic stenosis. Aorta: The aortic root is normal in size and structure. Venous: The inferior vena cava is normal in size with greater than 50% respiratory variability, suggesting right atrial pressure of 3 mmHg. IAS/Shunts: No atrial level shunt detected by color flow Doppler.  LEFT VENTRICLE PLAX 2D LVIDd:         4.90 cm   Diastology LVIDs:         3.10 cm   LV e' medial:    11.50 cm/s LV PW:         0.80 cm   LV E/e' medial:  9.7 LV IVS:        0.80 cm   LV e' lateral:   13.40 cm/s LVOT diam:     2.00 cm   LV E/e' lateral: 8.3 LV SV:         111 LV SV Index:   65 LVOT Area:     3.14 cm?  RIGHT VENTRICLE             IVC RV Basal diam:  4.10 cm     IVC diam: 1.80 cm RV Mid diam:    3.70 cm RV S prime:     12.80 cm/s TAPSE (M-mode): 2.0 cm LEFT ATRIUM             Index        RIGHT ATRIUM           Index LA diam:        3.10 cm 1.83 cm/m?   RA Area:     18.10 cm? LA Vol (A2C):   41.9 ml 24.68 ml/m?  RA Volume:   49.90 ml  29.39 ml/m? LA Vol (A4C):   43.6 ml 25.68  ml/m? LA Biplane Vol: 47.1 ml 27.74 ml/m?  AORTIC VALVE                     PULMONIC VALVE AV Area (Vmax):    2.97 cm?      PV Vmax:       1.19 m/s AV Area (Vmean):   2.64 cm?      PV Peak grad:  5.7 mmHg AV Area (

## 2021-12-01 NOTE — Progress Notes (Signed)
Incident with patients guest continuously vomiting led to escalation to the director about the issue. Resulting in the guest being forced to leave for safety reasons, patient is unhappy about situation. Patient may leave against medical advice, will continue to monitor situation. ?

## 2021-12-01 NOTE — Plan of Care (Signed)
?  Problem: Education: ?Goal: Knowledge of General Education information will improve ?Description: Including pain rating scale, medication(s)/side effects and non-pharmacologic comfort measures ?Outcome: Progressing ?  ?Problem: Clinical Measurements: ?Goal: Ability to maintain clinical measurements within normal limits will improve ?Outcome: Progressing ?  ?Problem: Nutrition: ?Goal: Adequate nutrition will be maintained ?Outcome: Progressing ?  ?Problem: Coping: ?Goal: Level of anxiety will decrease ?Outcome: Progressing ?  ?Problem: Elimination: ?Goal: Will not experience complications related to bowel motility ?Outcome: Progressing ?  ?Problem: Pain Managment: ?Goal: General experience of comfort will improve ?Outcome: Progressing ?  ?Problem: Safety: ?Goal: Ability to remain free from injury will improve ?Outcome: Progressing ?  ?

## 2021-12-02 ENCOUNTER — Encounter (HOSPITAL_COMMUNITY)
Admission: EM | Discharge status: Against Medical Advice | Payer: Self-pay | Source: Home / Self Care | Attending: Neurological Surgery

## 2021-12-02 ENCOUNTER — Inpatient Hospital Stay (HOSPITAL_COMMUNITY)
Admit: 2021-12-02 | Discharge: 2021-12-02 | Disposition: A | Discharge status: Home / Self Care | Payer: Self-pay | Attending: Home Health | Admitting: Home Health

## 2021-12-02 SURGERY — ECHOCARDIOGRAM, TRANSESOPHAGEAL
Anesthesia: Monitor Anesthesia Care

## 2021-12-02 NOTE — Progress Notes (Addendum)
This nurse was called into the patient room. Patient stated that she had not eaten anything all day. Patient was told that she is currently NPO as per her orders. This nurse explained to the patient why she is NPO and the patient stated she does not care and she will not be going to the procedure she was scheduled for even if they come to get her tomorrow, the next day or the next day. The NP (Meghan Doran Durand) was notified about this situation and gave a verbal order to change this patient's diet status from NPO to Regular. Patient was notified about the change.  ?

## 2021-12-02 NOTE — Progress Notes (Signed)
Subjective: ?The patient is alert.  She has a flat affect. ? ?Objective: ?Vital signs in last 24 hours: ?Temp:  [98.2 ?F (36.8 ?C)-99 ?F (37.2 ?C)] 99 ?F (37.2 ?C) (04/07 [id number]) ?Pulse Rate:  [79-90] 90 (04/07 0834) ?Resp:  [17-19] 17 (04/07 0834) ?BP: (119-140)/(78-90) 129/90 (04/07 0834) ?SpO2:  [98 %-100 %] 100 % (04/07 0834) ?Estimated body mass index is 31.25 kg/m? as calculated from the following: ?  Height as of this encounter: 5' (1.524 m). ?  Weight as of this encounter: 72.6 kg. ? ? ?Intake/Output from previous day: ?04/06 0701 - 04/07 0700 ?In: 720 [P.O.:720] ?Out: [id number] [Urine:3450; Drains:20] ?Intake/Output this shift: ?Total I/O ?In: -  ?Out: 800 [Urine:800] ? ?Physical exam the patient is alert.  She is paraplegic.  She has gross sensation in her bilateral lower extremities. ? ?Lab Results: ?No results for input(s): WBC, HGB, HCT, PLT in the last 72 hours. ?BMET ?No results for input(s): NA, K, CL, CO2, GLUCOSE, BUN, CREATININE, CALCIUM in the last 72 hours. ? ?Studies/Results: ?No results found. ? ?Assessment/Plan: ?Postop day #4: We will continue with IV antibiotics.  It looks like she will need rehab.  I have answered all her questions. ? LOS: 4 days  ? ? ? ?Ophelia Charter ?12/02/2021, 11:10 AM ? ? ? ? ? ?

## 2021-12-02 NOTE — Progress Notes (Signed)
PT Cancellation Note ? ?Patient Details ?Name: Crystal Costa ?MRN: [id number] ?DOB: Oct 04, 1995 ? ? ?Cancelled Treatment:    Reason Eval/Treat Not Completed: Patient declined, no reason specified; pt emotional regarding situation with her visitor yesterday. When asked to participate in therapy session, pt reports, "I don't know." Informed pt likely unable to be seen over weekend, pt reports understanding. Will follow-up for PT treatment as schedule permits. ? ?Ina Homes, PT, DPT ?Acute Rehabilitation Services  ?Pager [phone] ?Office [phone] ? ?Malachy Chamber ?12/02/2021, 2:50 PM ?

## 2021-12-02 NOTE — Progress Notes (Addendum)
Pt instructed nothing to eat and drink after midnight for TEE that is planned for 12/02/2021, and she also was asked to sign consent for procedure.  ?Pt refused to be NPO and to sign consent. Per pt, she does not think this procedure is necessary. ?Pt still has questions for doctor regarding TEE, and she is questioning necessity of the procedure.  ?Endo called for report and was informed that pt is refusing procedure at this time, and would like to speak to MD first. ?Pt did not have any food, just few sips of soda around 3 am and oxycodone with sips at 0545. ?Endo aware.  ?

## 2021-12-02 NOTE — Progress Notes (Signed)
?   ? ?[facility] ? ?Date of Admission:  11/28/2021    ?       ?Reason for visit: Follow up on MSSA bactermia ? ?Current antibiotics: ?Cefazolin 4/4 -- pres ?  ?Previous antibiotics: ?Vancomycin 4/3 -- pres ?Ceftriaxone 4/3 -- pres ? ? ?ASSESSMENT:   ? ?26 y.o. female admitted with: ? ?MSSA bacteremia: Blood cx positive 4/3 and negative as of 4/5 tp date ?T5-7 epidural abscess with resultant paraplegia: s/p thoracic laminectomy 4/3 with NSGY.  Cx also with MSSA to date. ?Hepatitis C: Ab positive and RNA elevated at 587,000 copies.  She is immune to hepatitis A and non-immune to hepatitis B. ?Sepsis: due to #1 and 2.  Improved. ?SUD ? ?RECOMMENDATIONS:   ? ?Continue ancef ?Stressed importance and rationale behind TEE.  Hopefully she will agree ?Lab monitoring ?Hepatitis C treatment as an outpatient ?Hepatitis B vaccination as an outpatient ?Dr Luciana Axe available as needed over weekend.  I will be back Monday. ? ? ?Principal Problem: ?  MSSA bacteremia ?Active Problems: ?  Epidural abscess ?  Paraplegia (HCC) ?  Sepsis due to methicillin susceptible Staphylococcus aureus (HCC) ?  Chronic viral hepatitis C (HCC) ?  Substance use disorder ? ? ? ?MEDICATIONS:   ? ?Scheduled Meds: ? celecoxib  200 mg Oral Q12H  ? Chlorhexidine Gluconate Cloth  6 each Topical Daily  ? gabapentin  300 mg Oral TID  ? mupirocin ointment   Nasal BID  ? pantoprazole  40 mg Oral Q24H  ? senna  1 tablet Oral BID  ? sodium chloride flush  3 mL Intravenous Q12H  ? ?Continuous Infusions: ? sodium chloride 250 mL (11/28/21 1950)  ? 0.9 % NaCl with KCl 20 mEq / L 75 mL/hr at 11/28/21 1958  ?  ceFAZolin (ANCEF) IV 2 g (12/02/21 8563)  ? methocarbamol (ROBAXIN) IV    ? ?PRN Meds:.menthol-cetylpyridinium **OR** phenol, methocarbamol **OR** methocarbamol (ROBAXIN) IV, ondansetron **OR** ondansetron (ZOFRAN) IV, oxyCODONE, sodium chloride flush, sodium phosphate ? ?SUBJECTIVE:  ? ?24 hour events:  ?No events ?Refusing to be NPO or  sign TEE consent ?Afebrile ?HCV RNA positive ? ?She is nervous about TEE.  She is thinking about ordering breakfast.  No other changes. ? ?Review of Systems  ?All other systems reviewed and are negative. ? ?  ?OBJECTIVE:  ? ?Blood pressure 129/90, pulse 90, temperature 99 ?F (37.2 ?C), temperature source Oral, resp. rate 17, height 5' (1.524 m), weight 72.6 kg, last menstrual period 10/30/2021, SpO2 100 %. ?Body mass index is 31.25 kg/m?. ? ?Physical Exam ?Constitutional:   ?   General: She is not in acute distress. ?   Appearance: Normal appearance.  ?HENT:  ?   Head: Normocephalic and atraumatic.  ?Eyes:  ?   Extraocular Movements: Extraocular movements intact.  ?   Conjunctiva/sclera: Conjunctivae normal.  ?Pulmonary:  ?   Effort: Pulmonary effort is normal. No respiratory distress.  ?Abdominal:  ?   General: There is no distension.  ?   Palpations: Abdomen is soft.  ?Musculoskeletal:  ?   Cervical back: Normal range of motion and neck supple.  ?   Comments: Lower extremities are plegic.  ?Skin: ?   General: Skin is warm and dry.  ?Neurological:  ?   General: No focal deficit present.  ?   Mental Status: She is alert and oriented to person, place, and time.  ?Psychiatric:     ?   Mood and Affect: Mood normal.     ?  Behavior: Behavior normal.  ? ? ? ?Lab Results: ?Lab Results  ?Component Value Date  ? WBC 23.3 (H) 11/29/2021  ? HGB 11.1 (L) 11/29/2021  ? HCT 33.4 (L) 11/29/2021  ? MCV 87.0 11/29/2021  ? PLT 443 (H) 11/29/2021  ?  ?Lab Results  ?Component Value Date  ? NA 136 11/29/2021  ? K 5.0 11/29/2021  ? CO2 23 11/29/2021  ? GLUCOSE 190 (H) 11/29/2021  ? BUN 15 11/29/2021  ? CREATININE 0.71 11/29/2021  ? CALCIUM 8.6 (L) 11/29/2021  ? GFRNONAA >60 11/29/2021  ?  ?Lab Results  ?Component Value Date  ? ALT 33 11/28/2021  ? AST 29 11/28/2021  ? ALKPHOS 102 11/28/2021  ? BILITOT 0.5 11/28/2021  ? ? ?   ?Component Value Date/Time  ? CRP 19.3 (H) 11/28/2021 1754  ? ? ?   ?Component Value Date/Time  ? ESRSEDRATE 82  (H) 11/28/2021 1754  ? ?  ?I have reviewed the micro and lab results in Epic. ? ?Imaging: ?No results found.  ? ?Imaging independently reviewed in Epic.  ? ? ?Kathlynn Grate ?[facility] ?[facility] ?[phone] pager ?12/02/2021, 9:25 AM ? ? ?

## 2021-12-02 NOTE — Plan of Care (Signed)
  Problem: Education: Goal: Knowledge of General Education information will improve Description: Including pain rating scale, medication(s)/side effects and non-pharmacologic comfort measures Outcome: Progressing   Problem: Clinical Measurements: Goal: Ability to maintain clinical measurements within normal limits will improve Outcome: Progressing   Problem: Nutrition: Goal: Adequate nutrition will be maintained Outcome: Progressing   Problem: Coping: Goal: Level of anxiety will decrease Outcome: Progressing   Problem: Safety: Goal: Ability to remain free from injury will improve Outcome: Progressing   Problem: Skin Integrity: Goal: Risk for impaired skin integrity will decrease Outcome: Progressing   

## 2021-12-03 LAB — AEROBIC/ANAEROBIC CULTURE W GRAM STAIN (SURGICAL/DEEP WOUND)

## 2021-12-03 MED ORDER — SODIUM CHLORIDE 0.9% FLUSH
10.0000 mL | INTRAVENOUS | Status: DC | PRN
  Administered 2021-12-05: 10 mL

## 2021-12-03 MED ORDER — SODIUM CHLORIDE 0.9% FLUSH
10.0000 mL | Freq: Two times a day (BID) | INTRAVENOUS | Status: DC
  Administered 2021-12-04: 10 mL
  Administered 2021-12-05: 30 mL
  Administered 2021-12-05 – 2021-12-06 (×2): 10 mL

## 2021-12-03 NOTE — Progress Notes (Signed)
Patient ID: Crystal Costa, female   DOB: 01-08-1996, 26 y.o.   MRN: [id number] ?BP 109/71 (BP Location: Left Arm)   Pulse 75   Temp 98.4 ?F (36.9 ?C) (Oral)   Resp 17   Ht 5' (1.524 m)   Wt 72.6 kg   LMP 10/30/2021 (Approximate)   SpO2 100%   BMI 31.25 kg/m?  ?Alert and oriented x 4, speech is clear and fluent ?No movement in the lower extremities ?Proprioception is intact ?Wound is clean, and dry ?

## 2021-12-03 NOTE — Plan of Care (Signed)

## 2021-12-03 NOTE — Progress Notes (Signed)

## 2021-12-03 NOTE — Progress Notes (Incomplete)
Pt has refused morning meds, ?

## 2021-12-03 NOTE — Progress Notes (Signed)
Patient RLE was cold to the touch. Was unable to feel a pulse, so Doppler was used. A faint/weak dorsalis pedis pulse was heard. NP Val Eagle) was notified. ? ?0530: RLE pulse was rechecked with Doppler and a moderate pulse was heard. RLE was warm to the touch. NP Val Eagle) was notified of change. ? ?

## 2021-12-04 NOTE — Progress Notes (Signed)
Patient ID: Crystal Costa, female   DOB: 12-25-1995, 26 y.o.   MRN: [id number] ?BP 121/81 (BP Location: Right Arm)   Pulse 62   Temp 98 ?F (36.7 ?C) (Oral)   Resp 17   Ht 5' (1.524 m)   Wt 72.6 kg   LMP 10/30/2021 (Approximate)   SpO2 100%   BMI 31.25 kg/m?  ?Alert and oriented ?Some wiggling of toes, right foot ?Wound is clean,and dry ?Continue pt ?

## 2021-12-05 LAB — CULTURE, BLOOD (ROUTINE X 2)
Culture: NO GROWTH
Culture: NO GROWTH
Special Requests: ADEQUATE

## 2021-12-05 LAB — CBC
HCT: 34.7 % — ABNORMAL LOW (ref 36.0–46.0)
Hemoglobin: 11.3 g/dL — ABNORMAL LOW (ref 12.0–15.0)
MCH: 29.4 pg (ref 26.0–34.0)
MCHC: 32.6 g/dL (ref 30.0–36.0)
MCV: 90.1 fL (ref 80.0–100.0)
Platelets: 553 10*3/uL — ABNORMAL HIGH (ref 150–400)
RBC: 3.85 MIL/uL — ABNORMAL LOW (ref 3.87–5.11)
RDW: 12.9 % (ref 11.5–15.5)
WBC: 8 10*3/uL (ref 4.0–10.5)
nRBC: 0 % (ref 0.0–0.2)

## 2021-12-05 LAB — BASIC METABOLIC PANEL
Anion gap: 6 (ref 5–15)
BUN: 22 mg/dL — ABNORMAL HIGH (ref 6–20)
CO2: 25 mmol/L (ref 22–32)
Calcium: 8.9 mg/dL (ref 8.9–10.3)
Chloride: 107 mmol/L (ref 98–111)
Creatinine, Ser: 0.74 mg/dL (ref 0.44–1.00)
GFR, Estimated: >60 mL/min (ref >60)
Glucose, Bld: 116 mg/dL — ABNORMAL HIGH (ref 70–99)
Potassium: 3.6 mmol/L (ref 3.5–5.1)
Sodium: 138 mmol/L (ref 135–145)

## 2021-12-05 MED ORDER — TRAMADOL HCL 50 MG PO TABS
50.0000 mg | ORAL_TABLET | Freq: Four times a day (QID) | ORAL | Status: DC
  Administered 2021-12-05 – 2021-12-07 (×8): 50 mg via ORAL
  Filled 2021-12-05 (×8): qty 1

## 2021-12-05 MED ORDER — ENOXAPARIN SODIUM 30 MG/0.3ML IJ SOSY
30.0000 mg | PREFILLED_SYRINGE | Freq: Two times a day (BID) | INTRAMUSCULAR | Status: DC
  Administered 2021-12-06 – 2021-12-07 (×2): 30 mg via SUBCUTANEOUS
  Filled 2021-12-05 (×2): qty 0.3

## 2021-12-05 NOTE — Progress Notes (Signed)
Occupational Therapy Treatment ?Patient Details ?Name: Crystal Costa ?MRN: [id number] ?DOB: 02-Jun-1996 ?Today's Date: 12/05/2021 ? ? ?History of present illness Pt is a 26 y.o. female admitted 11/28/21 with acute paraplegia of unknown duration. 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 S/p thoracic laminectomy T5-T7 for evacuation of epidural abscess on 4/3. No other significant PMH on file. ?  ?OT comments ? Pt progressing well towards goals, eager to participate in therapy this session. Pt able to complete bed mobility with min-mod A +2, holds self up statically at EOB with BUE. Pt able to complete reaching/weight shifting tasks at EOB, requiring min-mod A to maintain balance when reaching forward outside BOS. Pt completes lateral scooting with mod-max A +2 toward [facility] with use of bed pad. Pt provided with green theraband for UE exercise while in bed. Pt able to demo understanding of exercises. Pt presenting with impairments listed below, will follow acutely. Continue to recommend AIR at d/c.  ? ?Recommendations for follow up therapy are one component of a multi-disciplinary discharge planning process, led by the attending physician.  Recommendations may be updated based on patient status, additional functional criteria and insurance authorization. ?   ?Follow Up Recommendations ? Acute inpatient rehab (3hours/day)  ?  ?Assistance Recommended at Discharge Intermittent Supervision/Assistance  ?Patient can return home with the following ? Two people to help with walking and/or transfers;Two people to help with bathing/dressing/bathroom;Assistance with cooking/housework;Assist for transportation;Help with stairs or ramp for entrance ?  ?Equipment Recommendations ? Wheelchair (measurements OT);Wheelchair cushion (measurements OT);Tub/shower bench;Hospital bed;BSC/3in1;Other (comment)  ?  ?Recommendations for Other Services Rehab consult ? ?  ?Precautions / Restrictions Precautions ?Precautions:  Back;Fall;Other (comment) ?Precaution Booklet Issued: No ?Precaution Comments: T6 paraplegia, foley ?Restrictions ?Weight Bearing Restrictions: No  ? ? ?  ? ?Mobility Bed Mobility ?Overal bed mobility: Needs Assistance ?Bed Mobility: Supine to Sit, Sit to Supine ?Rolling: Min assist, Mod assist ?  ?Supine to sit: Min assist, +2 for physical assistance, Mod assist ?Sit to supine: Min assist, Mod assist, +2 for physical assistance ?  ?General bed mobility comments: assist for BLEs ?  ? ?Transfers ?Overall transfer level: Needs assistance ?  ?  ?  ?  ?  ?  ?  ?  ?General transfer comment: lateral scooting performed at EOB ?  ?  ?Balance Overall balance assessment: Needs assistance ?Sitting-balance support: Feet supported, Single extremity supported, Bilateral upper extremity supported ?Sitting balance-Leahy Scale: Fair ?Sitting balance - Comments: assistnace needed to reach outside BOS ?Postural control: Posterior lean ?  ?  ?  ?  ?  ?  ?  ?  ?  ?  ?  ?  ?  ?  ?   ? ?ADL either performed or assessed with clinical judgement  ? ?ADL Overall ADL's : Needs assistance/impaired ?  ?  ?  ?  ?  ?  ?  ?  ?  ?  ?Lower Body Dressing: Maximal assistance;Bed level ?Lower Body Dressing Details (indicate cue type and reason): to don socks/prafo boots ?  ?  ?  ?  ?  ?  ?Functional mobility during ADLs: Moderate assistance;Minimal assistance;+2 for physical assistance ?  ?  ? ?Extremity/Trunk Assessment Upper Extremity Assessment ?Upper Extremity Assessment: Generalized weakness ?  ?Lower Extremity Assessment ?Lower Extremity Assessment: Defer to PT evaluation ?  ?  ?  ? ?Vision   ?Vision Assessment?: No apparent visual deficits ?  ?Perception Perception ?Perception: Not tested ?  ?Praxis Praxis ?Praxis: Not tested ?  ? ?  Cognition Arousal/Alertness: Awake/alert ?Behavior During Therapy: [facility] for tasks assessed/performed, Flat affect ?Overall Cognitive Status: Within Functional Limits for tasks assessed ?  ?  ?  ?  ?  ?  ?  ?  ?  ?  ?  ?   ?  ?  ?  ?  ?  ?  ?  ?   ?Exercises   ? ?  ?Shoulder Instructions   ? ? ?  ?General Comments VSS on RA, reports sensation of urinary catheter and involuntary movement of toes  ? ? ?Pertinent Vitals/ Pain       Pain Assessment ?Pain Assessment: Faces ?Pain Score: 2  ?Pain Location: LLE with movement ?Pain Descriptors / Indicators: Burning, Discomfort ?Pain Intervention(s): Monitored during session, Limited activity within patient's tolerance, Repositioned ? ?Home Living   ?  ?  ?  ?  ?  ?  ?  ?  ?  ?  ?  ?  ?  ?  ?  ?  ?  ?  ? ?  ?Prior Functioning/Environment    ?  ?  ?  ?   ? ?Frequency ? Min 3X/week  ? ? ? ? ?  ?Progress Toward Goals ? ?OT Goals(current goals can now be found in the care plan section) ? Progress towards OT goals: Progressing toward goals ? ?Acute Rehab OT Goals ?Patient Stated Goal: to get better ?OT Goal Formulation: With patient ?Time For Goal Achievement: 12/13/21 ?Potential to Achieve Goals: Fair ?ADL Goals ?Pt Will Perform Grooming: with modified independence;bed level;sitting ?Pt Will Perform Upper Body Bathing: with modified independence;bed level;sitting ?Pt Will Transfer to Toilet: with transfer board;with min assist;bedside commode;with mod assist  ?Plan Discharge plan remains appropriate;Frequency remains appropriate   ? ?Co-evaluation ? ? ? PT/OT/SLP Co-Evaluation/Treatment: Yes ?Reason for Co-Treatment: Complexity of the patient's impairments (multi-system involvement);For patient/therapist safety;To address functional/ADL transfers ?  ?OT goals addressed during session: Strengthening/ROM ?  ? ?  ?AM-PAC OT "6 Clicks" Daily Activity     ?Outcome Measure ? ? Help from another person eating meals?: None ?Help from another person taking care of personal grooming?: A Little ?Help from another person toileting, which includes using toliet, bedpan, or urinal?: Total ?Help from another person bathing (including washing, rinsing, drying)?: A Lot ?Help from another person to put on and taking  off regular upper body clothing?: A Lot ?Help from another person to put on and taking off regular lower body clothing?: Total ?6 Click Score: 13 ? ?  ?End of Session   ? ?OT Visit Diagnosis: Muscle weakness (generalized) (M62.81);Other symptoms and signs involving the nervous system (R29.898) ?  ?Activity Tolerance Patient tolerated treatment well ?  ?Patient Left in bed;with call bell/phone within reach;with bed alarm set;with family/visitor present ?  ?Nurse Communication Mobility status;Need for lift equipment ?  ? ?   ? ?Time: [id number] ?OT Time Calculation (min): 32 min ? ?Charges: OT General Charges ?$OT Visit: 1 Visit ?OT Treatments ?$Therapeutic Activity: 8-22 mins ? ?Lynnda Child, OTD, OTR/L ?Acute Rehab ?[phone] ? ? ?Kaylyn Lim ?12/05/2021, 2:21 PM ?

## 2021-12-05 NOTE — TOC Progression Note (Signed)
Transition of Care (TOC) - Progression Note  ? ? ?Patient Details  ?Name: Olivia Mackie ?MRN: [id number] ?Date of Birth: 05/04/1996 ? ?Transition of Care (TOC) CM/SW Contact  ?Lorri Frederick, LCSW ?Phone Number: ?12/05/2021, 3:38 PM ? ?Clinical Narrative:   CSW asked to see pt regarding court appearance.  Pt asking for excuse letter for court date tomorrow in [facility] for herself and for her boyfriend.  Boyfriend at bedside saying he would like to stay with pt in the hospital.  CSW informed them that I can write letter stating pt is admitted but cannot write excuse letter for boyfriend. ? ?Letter faxed to [facility] [phone]. ? ? ? ?  ?  ? ?Expected Discharge Plan and Services ?  ?In-house Referral: Financial Counselor ?  ?  ?  ?                ?  ?  ?  ?  ?  ?  ?  ?  ?  ?  ? ? ?Social Determinants of Health (SDOH) Interventions ?  ? ?Readmission Risk Interventions ?   ? View : No data to display.  ?  ?  ?  ? ? ?

## 2021-12-05 NOTE — Progress Notes (Signed)
?   ? ?[facility] ? ?Date of Admission:  11/28/2021    ?       ?Reason for visit: Follow up on MSSA bactermia ?  ?Current antibiotics: ?Cefazolin 4/4 -- pres ?  ?Previous antibiotics: ?Vancomycin 4/3 -- pres ?Ceftriaxone 4/3 -- pres ? ? ?ASSESSMENT:   ? ?26 y.o. female admitted with: ? ?MSSA bacteremia: Blood cultures +4/3 and negative as of 4/5 ?T5-7 epidural abscess with resultant paraplegia: Status post thoracic laminectomy 4/3 with neurosurgery.  Operative cultures also with MSSA ?Hepatitis C: Antibody positive and RNA elevated at 587,000 copies.  She has immunity to hepatitis A but is not immune to hepatitis B. ?Sepsis due to 1 and #2.  Improved. ?Substance use disorder ? ?RECOMMENDATIONS:   ? ?Continue Ancef ?She will require at least 6 weeks of IV antibiotics for treatment of bacteremia with epidural abscess.  Also treating presumptively for endocarditis as she is declining to have a TEE ?Hepatitis C treatment can be done as an outpatient as well as hepatitis B vaccination ?Tentative end date for antibiotics will be 5/17 ?Recommend checking ESR and CRP every 2 weeks during therapy ?Recommend repeat MRI to follow-up on epidural abscess on approximately 5/3 ?Consider transfer to hospitalist service since she will be inpatient for several weeks ?Will follow periodically for now.  Please call with any questions. ? ? ?Principal Problem: ?  MSSA bacteremia ?Active Problems: ?  Epidural abscess ?  Paraplegia (Crystal Costa) ?  Sepsis due to methicillin susceptible Staphylococcus aureus ([place]) ?  Chronic viral hepatitis C ([place]) ?  Substance use disorder ? ? ? ?MEDICATIONS:   ? ?Scheduled Meds: ? celecoxib  200 mg Oral Q12H  ? Chlorhexidine Gluconate Cloth  6 each Topical Daily  ? gabapentin  300 mg Oral TID  ? mupirocin ointment   Nasal BID  ? pantoprazole  40 mg Oral Q24H  ? senna  1 tablet Oral BID  ? sodium chloride flush  10-40 mL Intracatheter Q12H  ? sodium chloride flush  3 mL Intravenous Q12H   ? ?Continuous Infusions: ? sodium chloride 250 mL (12/03/21 0524)  ? 0.9 % NaCl with KCl 20 mEq / L 75 mL/hr at 12/04/21 1744  ?  ceFAZolin (ANCEF) IV 2 g (12/05/21 0526)  ? methocarbamol (ROBAXIN) IV    ? ?PRN Meds:.menthol-cetylpyridinium **OR** phenol, methocarbamol **OR** methocarbamol (ROBAXIN) IV, ondansetron **OR** ondansetron (ZOFRAN) IV, oxyCODONE, sodium chloride flush, sodium chloride flush, sodium phosphate ? ?SUBJECTIVE:  ? ?24 hour events:  ?No acute events appear to be noted overnight ?Patient declined working with physical therapy on 4 7 ?Neurosurgery note from yesterday indicates that she had some wiggling of her toes ?TEE was canceled last week after she refused to be n.p.o. ?Repeat blood cultures from 4/5 remain no growth ?Afebrile Tmax 98.3 ? ?No acute complaints.  No fevers or chills.  States she is able to wiggle her toes today.  States she will work with therapy today.  She is still not sure if she wants to do a TEE at this time. ? ?Review of Systems  ?All other systems reviewed and are negative. ? ?  ?OBJECTIVE:  ? ?Blood pressure 106/63, pulse 69, temperature 98.3 ?F (36.8 ?C), temperature source Oral, resp. rate 16, height 5' (1.524 m), weight 72.6 kg, last menstrual period 10/30/2021, SpO2 100 %. ?Body mass index is 31.25 kg/m?. ? ?Physical Exam ?Constitutional:   ?   General: She is not in acute distress. ?   Appearance:  Normal appearance.  ?HENT:  ?   Head: Normocephalic and atraumatic.  ?Eyes:  ?   Extraocular Movements: Extraocular movements intact.  ?   Conjunctiva/sclera: Conjunctivae normal.  ?Abdominal:  ?   General: There is no distension.  ?   Palpations: Abdomen is soft.  ?Genitourinary: ?   Comments: Foley catheter remains in place ?Musculoskeletal:  ?   Comments: She is able to wiggle her toes slightly.  ?Skin: ?   General: Skin is warm and dry.  ?Neurological:  ?   General: No focal deficit present.  ?   Mental Status: She is alert and oriented to person, place, and time.   ?Psychiatric:     ?   Mood and Affect: Mood normal.     ?   Behavior: Behavior normal.  ? ? ? ?Lab Results: ?Lab Results  ?Component Value Date  ? WBC 23.3 (H) 11/29/2021  ? HGB 11.1 (L) 11/29/2021  ? HCT 33.4 (L) 11/29/2021  ? MCV 87.0 11/29/2021  ? PLT 443 (H) 11/29/2021  ?  ?Lab Results  ?Component Value Date  ? NA 136 11/29/2021  ? K 5.0 11/29/2021  ? CO2 23 11/29/2021  ? GLUCOSE 190 (H) 11/29/2021  ? BUN 15 11/29/2021  ? CREATININE 0.71 11/29/2021  ? CALCIUM 8.6 (L) 11/29/2021  ? GFRNONAA >60 11/29/2021  ?  ?Lab Results  ?Component Value Date  ? ALT 33 11/28/2021  ? AST 29 11/28/2021  ? ALKPHOS 102 11/28/2021  ? BILITOT 0.5 11/28/2021  ? ? ?   ?Component Value Date/Time  ? CRP 19.3 (H) 11/28/2021 1754  ? ? ?   ?Component Value Date/Time  ? ESRSEDRATE 82 (H) 11/28/2021 1754  ? ?  ?I have reviewed the micro and lab results in Epic. ? ?Imaging: ?No results found.  ? ?Imaging independently reviewed in Epic.  ? ? ?Crystal Costa ?[facility] ?[place] ?[phone] pager ?12/05/2021, 8:21 AM ? ? ? ?

## 2021-12-05 NOTE — Progress Notes (Addendum)
Subjective: ?Patient reports mild back pain and able to move toes on the right  ? ?Objective: ?Vital signs in last 24 hours: ?Temp:  [98 ?F (36.7 ?C)-98.3 ?F (36.8 ?C)] 98.3 ?F (36.8 ?C) (04/10 0747) ?Pulse Rate:  [58-72] 69 (04/10 0747) ?Resp:  [16-17] 16 (04/10 0747) ?BP: (106-121)/(59-81) 106/63 (04/10 0747) ?SpO2:  [100 %] 100 % (04/10 0747) ? ?Intake/Output from previous day: ?04/09 0701 - 04/10 0700 ?In: 120 [P.O.:120] ?Out: 1200 [Urine:1200] ?Intake/Output this shift: ?Total I/O ?In: 10 [I.V.:10] ?Out: -  ? ? ? ?Lab Results: ?Lab Results  ?Component Value Date  ? WBC 8.0 12/05/2021  ? HGB 11.3 (L) 12/05/2021  ? HCT 34.7 (L) 12/05/2021  ? MCV 90.1 12/05/2021  ? PLT 553 (H) 12/05/2021  ? ?Lab Results  ?Component Value Date  ? INR 1.2 11/28/2021  ? ?BMET ?Lab Results  ?Component Value Date  ? NA 138 12/05/2021  ? K 3.6 12/05/2021  ? CL 107 12/05/2021  ? CO2 25 12/05/2021  ? GLUCOSE 116 (H) 12/05/2021  ? BUN 22 (H) 12/05/2021  ? CREATININE 0.74 12/05/2021  ? CALCIUM 8.9 12/05/2021  ? ? ?Studies/Results: ?No results found. ? ?Assessment/Plan: ?Postop day 5 thoracic decompression for epidural abscess. Moving right toes now. Discussed plan of care with patient and not being able to go home with PICC line for at least 6 weeks because IV drug use. Patient was not very happy about this. Continue therapy for now. Changing OxyIR to tramadol to get her off of narcotics since she is only having back soreness.  ? ? LOS: 7 days  ? ? ?Crystal Costa ?12/05/2021, 9:52 AM ? ? ? ? ?

## 2021-12-05 NOTE — Progress Notes (Signed)
Physical Therapy Treatment ?Patient Details ?Name: Crystal Costa ?MRN: [id number] ?DOB: 1996/07/05 ?Today's Date: 12/05/2021 ? ? ?History of Present Illness Pt is a 26 y.o. female admitted 11/28/21 with acute paraplegia of unknown duration. 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 S/p thoracic laminectomy T5-T7 for evacuation of epidural abscess on 4/3. No other significant PMH on file. ? ?  ?PT Comments  ? ? Pt seen for PT/OT session with good progress towards goals, good participation and motivation. Pt able to complete bed mobility with mod A+2. Pt requiring min-mod A+2 to maintain sitting balance at EOB while reaching outside BOS laterally as well as anterior. Pt able to maintain sitting EOB statically with min guard for safety. Pt able to scoot along EOB toward head with mod-max A +2 with cues for head/hips relationship and use of bed pad. PROM and manual stretching of BLE performed with good tolerance. Current plan remains appropriate to address deficits and maximize functional independence and decrease caregiver burden. Pt continues to benefit from skilled PT services to progress toward functional mobility goals.  ?  ?Recommendations for follow up therapy are one component of a multi-disciplinary discharge planning process, led by the attending physician.  Recommendations may be updated based on patient status, additional functional criteria and insurance authorization. ? ?Follow Up Recommendations ? Acute inpatient rehab (3hours/day) ?  ?  ?Assistance Recommended at Discharge Frequent or constant Supervision/Assistance  ?Patient can return home with the following Two people to help with walking and/or transfers;A lot of help with bathing/dressing/bathroom;Assistance with cooking/housework;Assist for transportation;Help with stairs or ramp for entrance ?  ?Equipment Recommendations ? Wheelchair (measurements PT);Wheelchair cushion (measurements PT);Hospital bed;BSC/3in1;Other (comment)   ?  ?Recommendations for Other Services Rehab consult ? ? ?  ?Precautions / Restrictions Precautions ?Precautions: Back;Fall;Other (comment) ?Precaution Booklet Issued: No ?Precaution Comments: T6 paraplegia, foley ?Restrictions ?Weight Bearing Restrictions: No  ?  ? ?Mobility ? Bed Mobility ?Overal bed mobility: Needs Assistance ?Bed Mobility: Supine to Sit, Sit to Supine ?Rolling: Min assist, Mod assist ?  ?Supine to sit: Min assist, +2 for physical assistance, Mod assist ?Sit to supine: Min assist, Mod assist, +2 for physical assistance ?  ?General bed mobility comments: assist for BLEs ?  ? ?Transfers ?Overall transfer level: Needs assistance ?  ?  ?  ?  ?  ?  ?  ?  ?General transfer comment: lateral scooting performed at EOB ?  ? ?Ambulation/Gait ?  ?  ?  ?  ?  ?  ?  ?  ? ? ?Stairs ?  ?  ?  ?  ?  ? ? ?Wheelchair Mobility ?  ? ?Modified Rankin (Stroke Patients Only) ?  ? ? ?  ?Balance Overall balance assessment: Needs assistance ?Sitting-balance support: Feet supported, Single extremity supported, Bilateral upper extremity supported ?Sitting balance-Leahy Scale: Fair ?Sitting balance - Comments: assistnace needed to reach outside BOS ?Postural control: Posterior lean ?  ?  ?  ?  ?  ?  ?  ?  ?  ?  ?  ?  ?  ?  ?  ? ?  ?Cognition Arousal/Alertness: Awake/alert ?Behavior During Therapy: [facility] for tasks assessed/performed, Flat affect ?Overall Cognitive Status: Within Functional Limits for tasks assessed ?  ?  ?  ?  ?  ?  ?  ?  ?  ?  ?  ?  ?  ?  ?  ?  ?  ?  ?  ? ?  ?Exercises General Exercises -  Lower Extremity ?Ankle Circles/Pumps: PROM, Right, Left, 10 reps, Seated ?Long Arc Quad: PROM, Right, Left, 10 reps, Seated ?Hip Flexion/Marching: PROM, Right, Left, 10 reps, Supine ?Other Exercises ?Other Exercises: Sitting: progression from posterior to anterior prop, cervical rotations to L/R, cervical flexion/extension, lateral leans onto elbows ?Other Exercises: sustain supine stretch B hamstrings x2 ea side ? ?  ?General  Comments General comments (skin integrity, edema, etc.): VSS on RA, reports sensation of urinary catheter and involuntary movement of toes ?  ?  ? ?Pertinent Vitals/Pain Pain Assessment ?Pain Assessment: Faces ?Pain Score: 2  ?Pain Location: LLE with movement ?Pain Descriptors / Indicators: Burning, Discomfort ?Pain Intervention(s): Limited activity within patient's tolerance, Monitored during session, Repositioned  ? ? ?Home Living   ?  ?  ?  ?  ?  ?  ?  ?  ?  ?   ?  ?Prior Function    ?  ?  ?   ? ?PT Goals (current goals can now be found in the care plan section) Acute Rehab PT Goals ?PT Goal Formulation: With patient ?Time For Goal Achievement: 12/13/21 ? ?  ?Frequency ? ? ? Min 4X/week ? ? ? ?  ?PT Plan Current plan remains appropriate  ? ? ?Co-evaluation PT/OT/SLP Co-Evaluation/Treatment: Yes ?Reason for Co-Treatment: Complexity of the patient's impairments (multi-system involvement);For patient/therapist safety;To address functional/ADL transfers ?PT goals addressed during session: Mobility/safety with mobility;Balance;Strengthening/ROM ?OT goals addressed during session: Strengthening/ROM ?  ? ?  ?AM-PAC PT "6 Clicks" Mobility   ?Outcome Measure ? Help needed turning from your back to your side while in a flat bed without using bedrails?: A Lot ?Help needed moving from lying on your back to sitting on the side of a flat bed without using bedrails?: A Lot ?Help needed moving to and from a bed to a chair (including a wheelchair)?: Total ?Help needed standing up from a chair using your arms (e.g., wheelchair or bedside chair)?: Total ?Help needed to walk in hospital room?: Total ?Help needed climbing 3-5 steps with a railing? : Total ?6 Click Score: 8 ? ?  ?End of Session   ?Activity Tolerance: Patient tolerated treatment well ?Patient left: in bed;with call bell/phone within reach;with bed alarm set ?Nurse Communication: Mobility status ?PT Visit Diagnosis: Other abnormalities of gait and mobility  (R26.89);Pain ?Pain - part of body: Leg ?  ? ? ?Time: [id number] ?PT Time Calculation (min) (ACUTE ONLY): 30 min ? ?Charges:  $Therapeutic Activity: 8-22 mins          ?          ? ?Audry Riles. PTA ?Acute Rehabilitation Services ?Office: [phone] ? ? ? ?Betsey Holiday Kerri-Anne Haeberle ?12/05/2021, 3:32 PM ? ?

## 2021-12-06 NOTE — Plan of Care (Signed)
°  Problem: Nutrition: °Goal: Adequate nutrition will be maintained °Outcome: Progressing °  °Problem: Coping: °Goal: Level of anxiety will decrease °Outcome: Progressing °  °Problem: Safety: °Goal: Ability to remain free from injury will improve °Outcome: Progressing °  °

## 2021-12-06 NOTE — Progress Notes (Addendum)
Message left with Dr. Yetta Barre office regarding foley catheter still in place and no H&P in chart.  Awaiting call back.  ? ?1054 - Returned call and orders given to dc foley and see if patient is able to void.  Also reported that the Consult Note serves as the H&P. ?

## 2021-12-06 NOTE — Progress Notes (Signed)
Subjective: ?Patient reports mild soreness, no change in ability to move legs ? ?Objective: ?Vital signs in last 24 hours: ?Temp:  [98.1 ?F (36.7 ?C)-98.6 ?F (37 ?C)] 98.6 ?F (37 ?C) (04/11 0455) ?Pulse Rate:  [60-69] 60 (04/11 0455) ?Resp:  [16-17] 17 (04/11 0455) ?BP: (103-121)/(59-63) 103/63 (04/11 0455) ?SpO2:  [99 %-100 %] 99 % (04/11 0455) ? ?Intake/Output from previous day: ?04/10 0701 - 04/11 0700 ?In: 250 [P.O.:240; I.V.:10] ?Out: 550 [Urine:550] ?Intake/Output this shift: ?No intake/output data recorded. ? ?Neurologic: Grossly normal uppers, but no mvmt LLE and can somewhat DF/PF R foot and toes, no prox  RLE strength ? ?Lab Results: ?Lab Results  ?Component Value Date  ? WBC 8.0 12/05/2021  ? HGB 11.3 (L) 12/05/2021  ? HCT 34.7 (L) 12/05/2021  ? MCV 90.1 12/05/2021  ? PLT 553 (H) 12/05/2021  ? ?Lab Results  ?Component Value Date  ? INR 1.2 11/28/2021  ? ?BMET ?Lab Results  ?Component Value Date  ? NA 138 12/05/2021  ? K 3.6 12/05/2021  ? CL 107 12/05/2021  ? CO2 25 12/05/2021  ? GLUCOSE 116 (H) 12/05/2021  ? BUN 22 (H) 12/05/2021  ? CREATININE 0.74 12/05/2021  ? CALCIUM 8.9 12/05/2021  ? ? ?Studies/Results: ?No results found. ? ?Assessment/Plan: ?Continue IV ancef per ID ?PT/OT...needs rehab ? ?Estimated body mass index is 31.25 kg/m? as calculated from the following: ?  Height as of this encounter: 5' (1.524 m). ?  Weight as of this encounter: 72.6 kg. ? ? ? LOS: 8 days  ? ? ?Crystal Costa ?12/06/2021, 7:45 AM ? ? ? ? ?

## 2021-12-06 NOTE — Progress Notes (Signed)
Physical Therapy Treatment ?Patient Details ?Name: Crystal Costa ?MRN: [id number] ?DOB: 01-24-1996 ?Today's Date: 12/06/2021 ? ? ?History of Present Illness Pt is a 26 y.o. female admitted 11/28/21 with acute paraplegia of unknown duration. 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 S/p thoracic laminectomy T5-T7 for evacuation of epidural abscess on 4/3. No other significant PMH on file. ? ?  ?PT Comments  ? ? Pt received supine and agreeable to session with good progress towards goals. Pt able to come to long sitting in bed with light min assist to elevate trunk, once long sitting pt able to self mobilize BLE's with hands needing verbal and tactile cueing for hand placement and strategies with min assist to bring them to/off and back on EOB. Pt requiring min a for static and dynamic propped sitting balance. Pt declining transfer to chair, able to laterally scoot toward [facility] with mod assist and use of bed pad. Pt continues to benefit from skilled PT services to progress toward functional mobility goals.  ?  ?Recommendations for follow up therapy are one component of a multi-disciplinary discharge planning process, led by the attending physician.  Recommendations may be updated based on patient status, additional functional criteria and insurance authorization. ? ?Follow Up Recommendations ? Acute inpatient rehab (3hours/day) ?  ?  ?Assistance Recommended at Discharge Frequent or constant Supervision/Assistance  ?Patient can return home with the following Two people to help with walking and/or transfers;A lot of help with bathing/dressing/bathroom;Assistance with cooking/housework;Assist for transportation;Help with stairs or ramp for entrance ?  ?Equipment Recommendations ? Wheelchair (measurements PT);Wheelchair cushion (measurements PT);Hospital bed;BSC/3in1;Other (comment)  ?  ?Recommendations for Other Services Rehab consult ? ? ?  ?Precautions / Restrictions Precautions ?Precautions:  Back;Fall;Other (comment) ?Precaution Booklet Issued: No ?Precaution Comments: T6 paraplegia, foley ?Restrictions ?Weight Bearing Restrictions: No  ?  ? ?Mobility ? Bed Mobility ?Overal bed mobility: Needs Assistance ?Bed Mobility: Supine to Sit, Sit to Supine ?Rolling: Min assist ?  ?Supine to sit: Min assist ?Sit to supine: Min assist ?  ?General bed mobility comments: able to self mobilize BLE's with hands in long sitting min assist to complete ?  ? ?Transfers ?Overall transfer level: Needs assistance ?  ?  ?  ?  ?  ?  ?  ?  ?General transfer comment: lateral scooting performed at EOB ?  ? ?Ambulation/Gait ?  ?  ?  ?  ?  ?  ?  ?  ? ? ?Stairs ?  ?  ?  ?  ?  ? ? ?Wheelchair Mobility ?  ? ?Modified Rankin (Stroke Patients Only) ?  ? ? ?  ?Balance Overall balance assessment: Needs assistance ?Sitting-balance support: Feet supported, Single extremity supported, Bilateral upper extremity supported ?Sitting balance-Leahy Scale: Fair ?Sitting balance - Comments: able to reach outside BOS with single UE ?  ?  ?  ?  ?  ?  ?  ?  ?  ?  ?  ?  ?  ?  ?  ?  ? ?  ?Cognition Arousal/Alertness: Awake/alert ?Behavior During Therapy: [facility] for tasks assessed/performed, Flat affect ?Overall Cognitive Status: Within Functional Limits for tasks assessed ?  ?  ?  ?  ?  ?  ?  ?  ?  ?  ?  ?  ?  ?  ?  ?  ?General Comments: demonstrated good carryover for bed mobility; motivated to participate and taking directions well ?  ?  ? ?  ?Exercises General Exercises - Lower  Extremity ?Ankle Circles/Pumps: Right, 10 reps, Seated, AROM ?Long Arc Quad: Right, 10 reps, Seated, AROM ?Other Exercises ?Other Exercises: reaching outside BOS in propped sitting with single UE ?Other Exercises: sustained supine stretch B hamstrings x2 ea side ? ?  ?General Comments   ?  ?  ? ?Pertinent Vitals/Pain Pain Assessment ?Pain Assessment: Faces ?Faces Pain Scale: Hurts a little bit ?Pain Location: back ?Pain Descriptors / Indicators: Discomfort ?Pain Intervention(s):  Limited activity within patient's tolerance, Monitored during session, Repositioned  ? ? ?Home Living   ?  ?  ?  ?  ?  ?  ?  ?  ?  ?   ?  ?Prior Function    ?  ?  ?   ? ?PT Goals (current goals can now be found in the care plan section) Acute Rehab PT Goals ?PT Goal Formulation: With patient ?Time For Goal Achievement: 12/13/21 ? ?  ?Frequency ? ? ? Min 4X/week ? ? ? ?  ?PT Plan Current plan remains appropriate  ? ? ?Co-evaluation   ?  ?  ?  ?  ? ?  ?AM-PAC PT "6 Clicks" Mobility   ?Outcome Measure ? Help needed turning from your back to your side while in a flat bed without using bedrails?: A Lot ?Help needed moving from lying on your back to sitting on the side of a flat bed without using bedrails?: A Lot ?Help needed moving to and from a bed to a chair (including a wheelchair)?: Total ?Help needed standing up from a chair using your arms (e.g., wheelchair or bedside chair)?: Total ?Help needed to walk in hospital room?: Total ?Help needed climbing 3-5 steps with a railing? : Total ?6 Click Score: 8 ? ?  ?End of Session   ?Activity Tolerance: Patient tolerated treatment well ?Patient left: in bed;with call bell/phone within reach;with bed alarm set ?Nurse Communication: Mobility status ?PT Visit Diagnosis: Other abnormalities of gait and mobility (R26.89);Pain ?Pain - part of body: Leg ?  ? ? ?Time: 3154-0086 ?PT Time Calculation (min) (ACUTE ONLY): 26 min ? ?Charges:  $Therapeutic Exercise: 8-22 mins ?$Therapeutic Activity: 8-22 mins          ?          ? ?Lenora Boys. PTA ?Acute Rehabilitation Services ?Office: [phone] ? ? ? ?Marlana Salvage Marilynne Dupuis ?12/06/2021, 3:53 PM ? ?

## 2021-12-07 NOTE — Progress Notes (Signed)
PT Cancellation Note ? ?Patient Details ?Name: Crystal Costa ?MRN: [id number] ?DOB: December 18, 1995 ? ? ?Cancelled Treatment:    Reason Eval/Treat Not Completed: Other (comment), pt left AMA. ? ? ?Marlana Salvage Khi Mcmillen ?12/07/2021, 1:13 PM ?

## 2021-12-07 NOTE — Progress Notes (Signed)
Midline removed. PT left AMA paper signed. Pt again educated about risk and still chose to sign paper ?

## 2021-12-07 NOTE — Progress Notes (Signed)
Called placed back to neurosurgeon office to inform that pt. Wants to leave AMA. They said they would call MD. Cala Bradford called back and made aware of situation said she would come talk to her made her aware that pt still has not voided bladder scanned showed 341.  ?

## 2021-12-07 NOTE — Progress Notes (Signed)
IVT consulted to pull midline.  RN, Amrah advised she is able to pull midline. ?

## 2021-12-07 NOTE — Progress Notes (Signed)
Pt is requesting to leave says she know someone that does home health care that is willing to help her with her PICC line. Called placed at 1049 to neurosurgeon office and voicemail left with Karn Pickler for MD. Awaiting return call. ?

## 2021-12-07 NOTE — Progress Notes (Signed)
Subjective: ?Patient reports no real pain, still some numbness in legs, some burning in L leg, now moving her R leg ? ?Objective: ?Vital signs in last 24 hours: ?Temp:  [98.3 ?F (36.8 ?C)] 98.3 ?F (36.8 ?C) (04/11 2025) ?Pulse Rate:  [65] 65 (04/11 2025) ?BP: (111)/(56) 111/56 (04/11 2025) ?SpO2:  [100 %] 100 % (04/11 2025) ? ?Intake/Output from previous day: ?04/11 0701 - 04/12 0700 ?In: -  ?Out: 651 [Urine:650; Stool:1] ?Intake/Output this shift: ?No intake/output data recorded. ? ?Neurologic: Grossly normal UE, now almost 3/5 RLE all muscle groups, no mvmt LLE but has gross sensation ?Lab Results: ?Lab Results  ?Component Value Date  ? WBC 8.0 12/05/2021  ? HGB 11.3 (L) 12/05/2021  ? HCT 34.7 (L) 12/05/2021  ? MCV 90.1 12/05/2021  ? PLT 553 (H) 12/05/2021  ? ?Lab Results  ?Component Value Date  ? INR 1.2 11/28/2021  ? ?BMET ?Lab Results  ?Component Value Date  ? NA 138 12/05/2021  ? K 3.6 12/05/2021  ? CL 107 12/05/2021  ? CO2 25 12/05/2021  ? GLUCOSE 116 (H) 12/05/2021  ? BUN 22 (H) 12/05/2021  ? CREATININE 0.74 12/05/2021  ? CALCIUM 8.9 12/05/2021  ? ? ?Studies/Results: ?No results found. ? ?Assessment/Plan: ?Improving, now moving RLE fairly well. Needs to be taught to I&O cath herself. NEEDS REHAB, continue ABX ? ?Estimated body mass index is 31.25 kg/m? as calculated from the following: ?  Height as of this encounter: 5' (1.524 m). ?  Weight as of this encounter: 72.6 kg. ? ? ? LOS: 9 days  ? ? ?Crystal Costa ?12/07/2021, 8:25 AM ? ? ? ? ?

## 2021-12-07 NOTE — Progress Notes (Signed)
I spent considerable time talking to her about leaving AMA about 45 minutes ago.  I explained the risks of discharge.  I explained the risks of further bacteremia and infection whether it be the heart valves or the epidural space or the disc space or wherever.  We talked about the risk of UTI and urosepsis.  We talked about the risks of paraplegia or quadriplegia with recurrent or new epidural abscess.  We talked about her inability to manage herself at home as a functional paraplegic.  She does not have a wheelchair, she does not have a walker, she does not know how to transfer, she does not know how to in and out cath herself, she does not have IV antibiotics or oral antibiotics or insurance or therapy or home health and I do not think she is going to pay for antibiotics.  I think she will return quickly either after a fall, with urosepsis, with bacteremia and further abscess formation at some location in her body, inability to urinate, inability to take care of herself or other reason.  Her risks are extremely high and I urged her to stay in the hospital and receive care.  It appears, despite this long conversation that she has left the hospital AGAINST MEDICAL ADVICE.  My nurse practitioner and I spent quite a long time in the room trying to talk her out of this and to understand the implications of leaving.  I think the implications are dire. ?

## 2022-05-28 DEATH — deceased
